# Patient Record
Sex: Female | Born: 1975 | Race: Black or African American | Hispanic: No | Marital: Single | Smoking: Never smoker
Health system: Southern US, Community
[De-identification: ages and names within clinical notes are randomized; demographics above are authoritative.]

## PROBLEM LIST (undated history)

## (undated) DIAGNOSIS — Z973 Presence of spectacles and contact lenses: Secondary | ICD-10-CM

## (undated) DIAGNOSIS — G43909 Migraine, unspecified, not intractable, without status migrainosus: Secondary | ICD-10-CM

## (undated) DIAGNOSIS — H509 Unspecified strabismus: Secondary | ICD-10-CM

## (undated) DIAGNOSIS — O24419 Gestational diabetes mellitus in pregnancy, unspecified control: Secondary | ICD-10-CM

## (undated) DIAGNOSIS — IMO0002 Reserved for concepts with insufficient information to code with codable children: Secondary | ICD-10-CM

## (undated) DIAGNOSIS — R609 Edema, unspecified: Secondary | ICD-10-CM

## (undated) DIAGNOSIS — K219 Gastro-esophageal reflux disease without esophagitis: Secondary | ICD-10-CM

## (undated) DIAGNOSIS — D649 Anemia, unspecified: Secondary | ICD-10-CM

## (undated) HISTORY — DX: Gastro-esophageal reflux disease without esophagitis: K21.9

## (undated) HISTORY — DX: Migraine, unspecified, not intractable, without status migrainosus: G43.909

## (undated) HISTORY — DX: Presence of spectacles and contact lenses: Z97.3

## (undated) HISTORY — DX: Anemia, unspecified: D64.9

## (undated) HISTORY — PX: TONSILLECTOMY: SUR1361

## (undated) HISTORY — DX: Reserved for concepts with insufficient information to code with codable children: IMO0002

## (undated) HISTORY — DX: Unspecified strabismus: H50.9

## (undated) HISTORY — PX: CHOLECYSTECTOMY: SHX55

## (undated) HISTORY — PX: KNEE SURGERY: SHX244

## (undated) HISTORY — DX: Edema, unspecified: R60.9

## (undated) HISTORY — DX: Gestational diabetes mellitus in pregnancy, unspecified control: O24.419

---

## 2000-07-12 DIAGNOSIS — O24419 Gestational diabetes mellitus in pregnancy, unspecified control: Secondary | ICD-10-CM

## 2000-07-12 HISTORY — DX: Gestational diabetes mellitus in pregnancy, unspecified control: O24.419

## 2009-11-11 ENCOUNTER — Emergency Department: Payer: Self-pay | Admitting: Emergency Medicine

## 2010-02-21 ENCOUNTER — Emergency Department: Payer: Self-pay | Admitting: Emergency Medicine

## 2012-07-12 DIAGNOSIS — R609 Edema, unspecified: Secondary | ICD-10-CM

## 2012-07-12 HISTORY — DX: Edema, unspecified: R60.9

## 2012-09-23 ENCOUNTER — Emergency Department (HOSPITAL_COMMUNITY)
Admission: EM | Admit: 2012-09-23 | Discharge: 2012-09-23 | Disposition: A | Discharge status: Home / Self Care | Payer: BC Managed Care – PPO | Source: Home / Self Care | Attending: Emergency Medicine | Admitting: Emergency Medicine

## 2012-09-23 ENCOUNTER — Emergency Department (INDEPENDENT_AMBULATORY_CARE_PROVIDER_SITE_OTHER): Payer: BC Managed Care – PPO

## 2012-09-23 ENCOUNTER — Encounter (HOSPITAL_COMMUNITY): Payer: Self-pay | Admitting: Emergency Medicine

## 2012-09-23 DIAGNOSIS — J40 Bronchitis, not specified as acute or chronic: Secondary | ICD-10-CM

## 2012-09-23 MED ORDER — ALBUTEROL SULFATE HFA 108 (90 BASE) MCG/ACT IN AERS: 1.0000 | INHALATION_SPRAY | RESPIRATORY_TRACT | Status: DC | PRN

## 2012-09-23 MED ORDER — IPRATROPIUM BROMIDE 0.02 % IN SOLN
0.5000 mg | Freq: Once | RESPIRATORY_TRACT | Status: AC
  Administered 2012-09-23: 0.5 mg via RESPIRATORY_TRACT

## 2012-09-23 MED ORDER — ALBUTEROL SULFATE (5 MG/ML) 0.5% IN NEBU
INHALATION_SOLUTION | RESPIRATORY_TRACT | Status: AC
  Filled 2012-09-23: qty 1

## 2012-09-23 MED ORDER — ALBUTEROL SULFATE (5 MG/ML) 0.5% IN NEBU
5.0000 mg | INHALATION_SOLUTION | Freq: Once | RESPIRATORY_TRACT | Status: AC
  Administered 2012-09-23: 5 mg via RESPIRATORY_TRACT

## 2012-09-23 MED ORDER — HYDROCOD POLST-CHLORPHEN POLST 10-8 MG/5ML PO LQCR: 5.0000 mL | Freq: Two times a day (BID) | ORAL | Status: DC | PRN

## 2012-09-23 MED ORDER — AZITHROMYCIN 250 MG PO TABS: 250.0000 mg | ORAL_TABLET | Freq: Every day | ORAL | Status: DC

## 2012-09-23 MED ORDER — PREDNISONE 20 MG PO TABS: 20.0000 mg | ORAL_TABLET | Freq: Two times a day (BID) | ORAL | Status: DC

## 2012-09-23 NOTE — ED Notes (Signed)
Pt c/o dry nonproductive persistent cough x 4 days. And headache. Pt states felt feverish and had chills last night. No hx of asthma or migraines. Pt has been taking tylenol cold and flu with no relief.  Denies n/v/d

## 2012-09-23 NOTE — ED Notes (Signed)
Patient receiving breathing treatment unable to transport to x-ray 

## 2012-09-27 NOTE — ED Provider Notes (Signed)
History     CSN: [id number]  Arrival date & time 09/23/12  1624   First MD Initiated Contact with Patient 09/23/12 1650      Chief Complaint  Patient presents with  . Cough    prersistant dry cough   . Headache     HPI: Patient is a 37 y.o. female presenting with cough and headaches. The history is provided by the patient.  Cough Cough characteristics:  Non-productive Progression:  Gradually worsening Chronicity:  New Associated symptoms: chills, ear fullness, fever, headaches and wheezing   Associated symptoms: no chest pain, no ear pain, no myalgias, no rash, no rhinorrhea, no shortness of breath, no sore throat and no weight loss   Headache Associated symptoms: cough and fever   Associated symptoms: no abdominal pain, no diarrhea, no drainage, no ear pain, no myalgias, no nausea, no sore throat and no vomiting   Cough This is a new problem. The current episode started in the past 7 days. The problem has been gradually worsening. The problem occurs every few minutes. The cough is non-productive. Associated symptoms include chills, ear congestion, a fever, headaches and wheezing. Pertinent negatives include no chest pain, ear pain, myalgias, nasal congestion, postnasal drip, rash, rhinorrhea, sore throat, shortness of breath, sweats or weight loss. She has tried nothing for the symptoms. There is no history of asthma, bronchitis, COPD, emphysema, environmental allergies or pneumonia.  Headache  Associated symptoms include coughing and a fever. Pertinent negatives include no abdominal pain, ear pain, nausea, rhinorrhea, sore throat, vomiting or weight loss.  Pt reports onset of persistent cough on Thursday that has gradually worsened. On Friday pt began to have chills and felt as if she had fever. The cough is a dry, forceful cough that often makes her feel dizzy. Pt denies any other associated URI type symptoms. Denies N/V/D, abd pain, CP or SOB. Pt does not smoke or have h/o asthma.  States she has coughed so much she has a h/a. Coughing makes the h/a worse. Denies known sick contacts.  History reviewed. No pertinent past medical history.  History reviewed. No pertinent past surgical history.  History reviewed. No pertinent family history.  History  Substance Use Topics  . Smoking status: Never Smoker   . Smokeless tobacco: Not on file  . Alcohol Use: No    OB History   Grav Para Term Preterm Abortions TAB SAB Ect Mult Living                  Review of Systems  Constitutional: Positive for fever and chills. Negative for weight loss.  HENT: Negative for ear pain, sore throat, rhinorrhea and postnasal drip.   Respiratory: Positive for cough and wheezing. Negative for shortness of breath.   Cardiovascular: Negative.  Negative for chest pain and palpitations.  Gastrointestinal: Negative.  Negative for nausea, vomiting, abdominal pain, diarrhea and constipation.  Endocrine: Negative.   Genitourinary: Negative.   Musculoskeletal: Negative.  Negative for myalgias.  Skin: Negative.  Negative for rash.  Allergic/Immunologic: Negative.  Negative for environmental allergies.  Neurological: Positive for headaches.  Hematological: Negative.   Psychiatric/Behavioral: Negative.     Allergies  Review of patient's allergies indicates not on file.  Home Medications   Current Outpatient Rx  Name  Route  Sig  Dispense  Refill  . Norethin Ace-Eth Estrad-FE (LOESTRIN FE 1/20 PO)   Oral   Take by mouth.         Marland Kitchen albuterol (PROVENTIL HFA;VENTOLIN HFA) 108 (  90 BASE) MCG/ACT inhaler   Inhalation   Inhale 1-2 puffs into the lungs every 4 (four) hours as needed for wheezing or shortness of breath (and or cough).   1 Inhaler   0   . azithromycin (ZITHROMAX Z-PAK) 250 MG tablet   Oral   Take 1 tablet (250 mg total) by mouth daily. Take 2 tabs on day 1, then 1 tab daily on days 2-5   6 tablet   0   . chlorpheniramine-HYDROcodone (TUSSIONEX PENNKINETIC ER) 10-8  MG/5ML LQCR   Oral   Take 5 mLs by mouth every 12 (twelve) hours as needed.   40 mL   0   . predniSONE (DELTASONE) 20 MG tablet   Oral   Take 1 tablet (20 mg total) by mouth 2 (two) times daily.   10 tablet   0     BP 135/100  Pulse 92  Temp(Src) 99.4 F (37.4 C) (Oral)  Resp 24  SpO2 100%  LMP 09/21/2012  Physical Exam  Constitutional: She is oriented to person, place, and time. She appears well-developed and well-nourished.  HENT:  Head: Normocephalic and atraumatic.  Right Ear: Tympanic membrane, external ear and ear canal normal.  Left Ear: Tympanic membrane, external ear and ear canal normal.  Nose: Nose normal.  Mouth/Throat: Uvula is midline, oropharynx is clear and moist and mucous membranes are normal.  Eyes: Conjunctivae are normal.  Neck: Neck supple.  Cardiovascular: Normal rate and regular rhythm.   Pulmonary/Chest:  Mildly tachypneic w/o accessory muscle use.  BBS w/ decreased air movement bil and expiratory wheezes.  Musculoskeletal: Normal range of motion.  Neurological: She is alert and oriented to person, place, and time.  Skin: Skin is warm and dry.  Psychiatric: She has a normal mood and affect.    ED Course  Procedures (including critical care time)  Labs Reviewed - No data to display No results found.   1. Bronchitis       MDM  3 day h/o persistent dry cough, skills and subjective fever. Cough gradually worsening, coughing spells causing h/a and at times dizziness. BBS w/ decreased air movement and exp wheezes. Much improved after 2 nebs. CXR neg for PNA. Afebrile here today. Will treat for bronchitis w/ Z-Pack, Albuterol HFA, Prednisone and short course of medication for cough. Discussed pt w/ Dr Lorenz Coaster who is in agreement w/ plan.         Leanne Chang, NP 09/28/12 [phone]

## 2012-09-28 NOTE — ED Provider Notes (Signed)
Medical screening examination/treatment/procedure(s) were performed by non-physician practitioner and as supervising physician I was immediately available for consultation/collaboration.  Leslee Home, M.D.  Reuben Likes, MD 09/28/12 [phone]

## 2013-02-14 ENCOUNTER — Ambulatory Visit (INDEPENDENT_AMBULATORY_CARE_PROVIDER_SITE_OTHER): Payer: BC Managed Care – PPO | Admitting: Emergency Medicine

## 2013-02-14 VITALS — BP 124/72 | HR 70 | Temp 97.8°F | Resp 18 | Ht 65.0 in | Wt 286.0 lb

## 2013-02-14 DIAGNOSIS — R609 Edema, unspecified: Secondary | ICD-10-CM

## 2013-02-14 DIAGNOSIS — R109 Unspecified abdominal pain: Secondary | ICD-10-CM

## 2013-02-14 DIAGNOSIS — Z309 Encounter for contraceptive management, unspecified: Secondary | ICD-10-CM

## 2013-02-14 DIAGNOSIS — M545 Low back pain, unspecified: Secondary | ICD-10-CM

## 2013-02-14 DIAGNOSIS — IMO0001 Reserved for inherently not codable concepts without codable children: Secondary | ICD-10-CM

## 2013-02-14 DIAGNOSIS — N898 Other specified noninflammatory disorders of vagina: Secondary | ICD-10-CM

## 2013-02-14 DIAGNOSIS — Z0000 Encounter for general adult medical examination without abnormal findings: Secondary | ICD-10-CM

## 2013-02-14 LAB — POCT URINALYSIS DIPSTICK
Bilirubin, UA: NEGATIVE
Glucose, UA: NEGATIVE
Ketones, UA: NEGATIVE
Leukocytes, UA: NEGATIVE

## 2013-02-14 LAB — POCT UA - MICROSCOPIC ONLY: Mucus, UA: NEGATIVE

## 2013-02-14 LAB — COMPREHENSIVE METABOLIC PANEL
AST: 15 U/L (ref 0–37)
Albumin: 4 g/dL (ref 3.5–5.2)
BUN: 9 mg/dL (ref 6–23)
Calcium: 9.3 mg/dL (ref 8.4–10.5)
Chloride: 104 mEq/L (ref 96–112)
Glucose, Bld: 96 mg/dL (ref 70–99)
Potassium: 4.4 mEq/L (ref 3.5–5.3)
Total Protein: 7.6 g/dL (ref 6.0–8.3)

## 2013-02-14 LAB — POCT WET PREP WITH KOH
Trichomonas, UA: NEGATIVE
Yeast Wet Prep HPF POC: NEGATIVE

## 2013-02-14 LAB — POCT CBC
Granulocyte percent: 53.7 %G (ref 37–80)
HCT, POC: 34.2 % — AB (ref 37.7–47.9)
Hemoglobin: 10.2 g/dL — AB (ref 12.2–16.2)
MCV: 70.6 fL — AB (ref 80–97)
POC LYMPH PERCENT: 37.7 %L (ref 10–50)
RBC: 4.85 M/uL (ref 4.04–5.48)
RDW, POC: 18.6 %

## 2013-02-14 LAB — IRON AND TIBC
%SAT: 4 % — ABNORMAL LOW (ref 20–55)
TIBC: 470 ug/dL (ref 250–470)

## 2013-02-14 MED ORDER — NORETHIN ACE-ETH ESTRAD-FE 1-20 MG-MCG PO TABS: 1.0000 | ORAL_TABLET | Freq: Every day | ORAL | Status: DC

## 2013-02-14 MED ORDER — METRONIDAZOLE 0.75 % VA GEL: 1.0000 | Freq: Two times a day (BID) | VAGINAL | Status: DC

## 2013-02-14 MED ORDER — FUROSEMIDE 20 MG PO TABS: ORAL_TABLET | ORAL | Status: DC

## 2013-02-14 NOTE — Patient Instructions (Signed)
Take a multivitamin with iron one a day. We have started you on your birth control pills he also had bacterial vaginitis and a prescription has been sent and for this

## 2013-02-14 NOTE — Progress Notes (Signed)
@[email or web address] @  Patient ID: Sherri Lindsey MRN: [id number], DOB: 27-Jun-1976, 37 y.o. Date of Encounter: 02/14/2013, 11:34 AM  Primary Physician: No primary provider on file.  Chief Complaint: Physical (CPE)  HPI: 37 y.o. y/o female with history of noted below here for CPE.  Doing well. No issues/complaints.  LMP: 02/03/2013 Pap: MMG: Review of Systems:  Consitutional: No fever, chills, fatigue, night sweats, lymphadenopathy, or weight changes. Eyes: No visual changes, eye redness, or discharge. ENT/Mouth: Ears: No otalgia, tinnitus, hearing loss, discharge.  Right ear cerumen impaction. Nose: No congestion, rhinorrhea, sinus pain, or epistaxis. Throat: No sore throat, post nasal drip, or teeth pain. Cardiovascular: No CP, palpitations, diaphoresis, DOE, orthopnea, PND. Has swelling in feet and ankles Respiratory: She has no respiratory complaints.  Gastrointestinal: No anorexia, dysphagia, reflux, pain, nausea, vomiting, hematemesis, diarrhea, constipation, BRBPR, or melena. Breast: No discharge, pain, swelling, or mass. Genitourinary: No dysuria, frequency, urgency, hematuria, incontinence, nocturia, amenorrhea, vaginal discharge, pruritis, burning, abnormal bleeding, or pain. Musculoskeletal: No decreased ROM, myalgias, joint swelling, or weakness. Both hands usually locks and stiffens up. Back spasms. Skin: No rash, erythema, lesion changes, pain, warmth, jaundice, or pruritis. Neurological: No headache, dizziness, syncope, seizures, tremors, memory loss, coordination problems, or paresthesias. Psychological: No anxiety, depression, hallucinations, SI/HI. Endocrine: No fatigue, polydipsia, polyphagia, polyuria, or known diabetes. All other systems were reviewed and are otherwise negative.  Past Medical History  Diagnosis Date  . Ulcer   . Diabetes mellitus without complication      Past Surgical History  Procedure Laterality Date  . Cholecystectomy      Home Meds:  Prior to  Admission medications   Medication Sig Start Date End Date Taking? Authorizing Provider  albuterol (PROVENTIL HFA;VENTOLIN HFA) 108 (90 BASE) MCG/ACT inhaler Inhale 1-2 puffs into the lungs every 4 (four) hours as needed for wheezing or shortness of breath (and or cough). 09/23/12   Roma Kayser Schorr, NP  azithromycin (ZITHROMAX Z-PAK) 250 MG tablet Take 1 tablet (250 mg total) by mouth daily. Take 2 tabs on day 1, then 1 tab daily on days 2-5 09/23/12   Roma Kayser Schorr, NP  chlorpheniramine-HYDROcodone (TUSSIONEX PENNKINETIC ER) 10-8 MG/5ML LQCR Take 5 mLs by mouth every 12 (twelve) hours as needed. 09/23/12   Roma Kayser Schorr, NP  Norethin Ace-Eth Estrad-FE (LOESTRIN FE 1/20 PO) Take by mouth.    Historical Provider, MD  predniSONE (DELTASONE) 20 MG tablet Take 1 tablet (20 mg total) by mouth 2 (two) times daily. 09/23/12   Leanne Chang, NP    Allergies: No Known Allergies  History   Social History  . Marital Status: Single    Spouse Name: N/A    Number of Children: N/A  . Years of Education: N/A   Occupational History  . Not on file.   Social History Main Topics  . Smoking status: Never Smoker   . Smokeless tobacco: Not on file  . Alcohol Use: No  . Drug Use: No  . Sexually Active: Yes    Birth Control/ Protection: Pill   Other Topics Concern  . Not on file   Social History Narrative  . No narrative on file    Family History  Problem Relation Age of Onset  . Hypertension Mother   . Hypertension Father   . Kidney failure Father   . Hyperlipidemia Father   . Hypertension Sister   . Kidney failure Sister   . Hypertension Brother   . Hypertension Maternal Grandmother     Physical Exam  Blood pressure 124/72, pulse 70, temperature 97.8 F (36.6 C), temperature source Oral, resp. rate 18, height 5\' 5"  (1.651 m), weight 286 lb (129.729 kg), last menstrual period 02/06/2013, SpO2 99.00%., Body mass index is 47.59 kg/(m^2). General: Well developed, well  nourished, in no acute distress. HEENT: Normocephalic, atraumatic. Conjunctiva pink, sclera non-icteric. Pupils 2 mm constricting to 1 mm, round, regular, and equally reactive to light and accomodation. EOMI. Internal auditory canal clear. TMs with good cone of light and without pathology. Nasal mucosa pink. Nares are without discharge. No sinus tenderness. Oral mucosa pink. Dentition. Pharynx without exudate.   Neck: Supple. Trachea midline. No thyromegaly. Full ROM. No lymphadenopathy. Lungs: Clear to auscultation bilaterally without wheezes, rales, or rhonchi. Breathing is of normal effort and unlabored. Cardiovascular: RRR with S1 S2. No murmurs, rubs, or gallops appreciated. Distal pulses 2+ symmetrically. No carotid or abdominal bruits Breast:  No masses are felt  Abdomen: Soft, non-tender, non-distended with normoactive bowel sounds. No hepatosplenomegaly or masses. No rebound/guarding. No CVA tenderness. Without hernias.  Genitourinary: normal female uterus is normal size there are no cervical erosions noted    Musculoskeletal: Full range of motion and 5/5 strength throughout. Without swelling, atrophy, tenderness, crepitus, or warmth. Extremities without clubbing, cyanosis, or edema. Calves supple. Skin: Warm and moist without erythema, ecchymosis, wounds, or rash. Neuro: A+Ox3. CN II-XII grossly intact. Moves all extremities spontaneously. Full sensation throughout. Normal gait. DTR 2+ throughout upper and lower extremities. Finger to nose intact. Psych:  Responds to questions appropriately with a normal affect.    there is 1+ swelling of the lower extremities.   Results for orders placed in visit on 02/14/13  POCT CBC      Result Value Range   WBC 5.1  4.6 - 10.2 K/uL   Lymph, poc 1.9  0.6 - 3.4   POC LYMPH PERCENT 37.7  10 - 50 %L   MID (cbc) 0.4  0 - 0.9   POC MID % 8.6  0 - 12 %M   POC Granulocyte 2.7  2 - 6.9   Granulocyte percent 53.7  37 - 80 %G   RBC 4.85  4.04 - 5.48 M/uL    Hemoglobin 10.2 (*) 12.2 - 16.2 g/dL   HCT, POC 82.9 (*) 56.2 - 47.9 %   MCV 70.6 (*) 80 - 97 fL   MCH, POC 21.0 (*) 27 - 31.2 pg   MCHC 29.8 (*) 31.8 - 35.4 g/dL   RDW, POC 13.0     Platelet Count, POC 326  142 - 424 K/uL   MPV 10.3  0 - 99.8 fL  POCT URINE PREGNANCY      Result Value Range   Preg Test, Ur Negative    POCT URINALYSIS DIPSTICK      Result Value Range   Color, UA yellow     Clarity, UA clear     Glucose, UA neg     Bilirubin, UA neg     Ketones, UA neg     Spec Grav, UA 1.025     Blood, UA small     pH, UA 6.0     Protein, UA neg     Urobilinogen, UA 0.2     Nitrite, UA neg     Leukocytes, UA Negative    POCT UA - MICROSCOPIC ONLY      Result Value Range   WBC, Ur, HPF, POC 0-3     RBC, urine, microscopic 3-5     Bacteria, U  Microscopic trace     Mucus, UA neg     Epithelial cells, urine per micros 3-5     Crystals, Ur, HPF, POC neg     Casts, Ur, LPF, POC neg     Yeast, UA neg    POCT WET PREP WITH KOH      Result Value Range   Trichomonas, UA Negative     Clue Cells Wet Prep HPF POC 3-6     Epithelial Wet Prep HPF POC 6-8     Yeast Wet Prep HPF POC neg     Bacteria Wet Prep HPF POC 1+     RBC Wet Prep HPF POC 4-7     WBC Wet Prep HPF POC 3-5     KOH Prep POC Negative       Assessment/Plan:  37 y.o. y/o female here for CPE  -  Signed, Earl Lites, MD 02/14/2013 11:34 AM

## 2013-02-15 LAB — SICKLE CELL SCREEN: Sickle Cell Screen: NEGATIVE

## 2013-02-15 LAB — PAP IG, CT-NG, RFX HPV ASCU

## 2013-02-15 LAB — URINE CULTURE: Organism ID, Bacteria: NO GROWTH

## 2013-02-15 LAB — HIV ANTIBODY (ROUTINE TESTING W REFLEX): HIV: NONREACTIVE

## 2013-02-22 ENCOUNTER — Telehealth: Payer: Self-pay

## 2013-02-22 NOTE — Telephone Encounter (Signed)
Patient is calling to find out lab results.  Please call at [phone].

## 2013-02-23 NOTE — Telephone Encounter (Signed)
Called pt, advised labs normal.

## 2014-01-01 IMAGING — CR DG CHEST 2V
2 series · 2 of 2 positions shown · non-contrast
Comparison: None.

CLINICAL DATA: Cough, headache.  Symptoms worse for the last 3
days.  Short of breath.

CHEST - 2 VIEW

[view not recorded (1 of 2)]
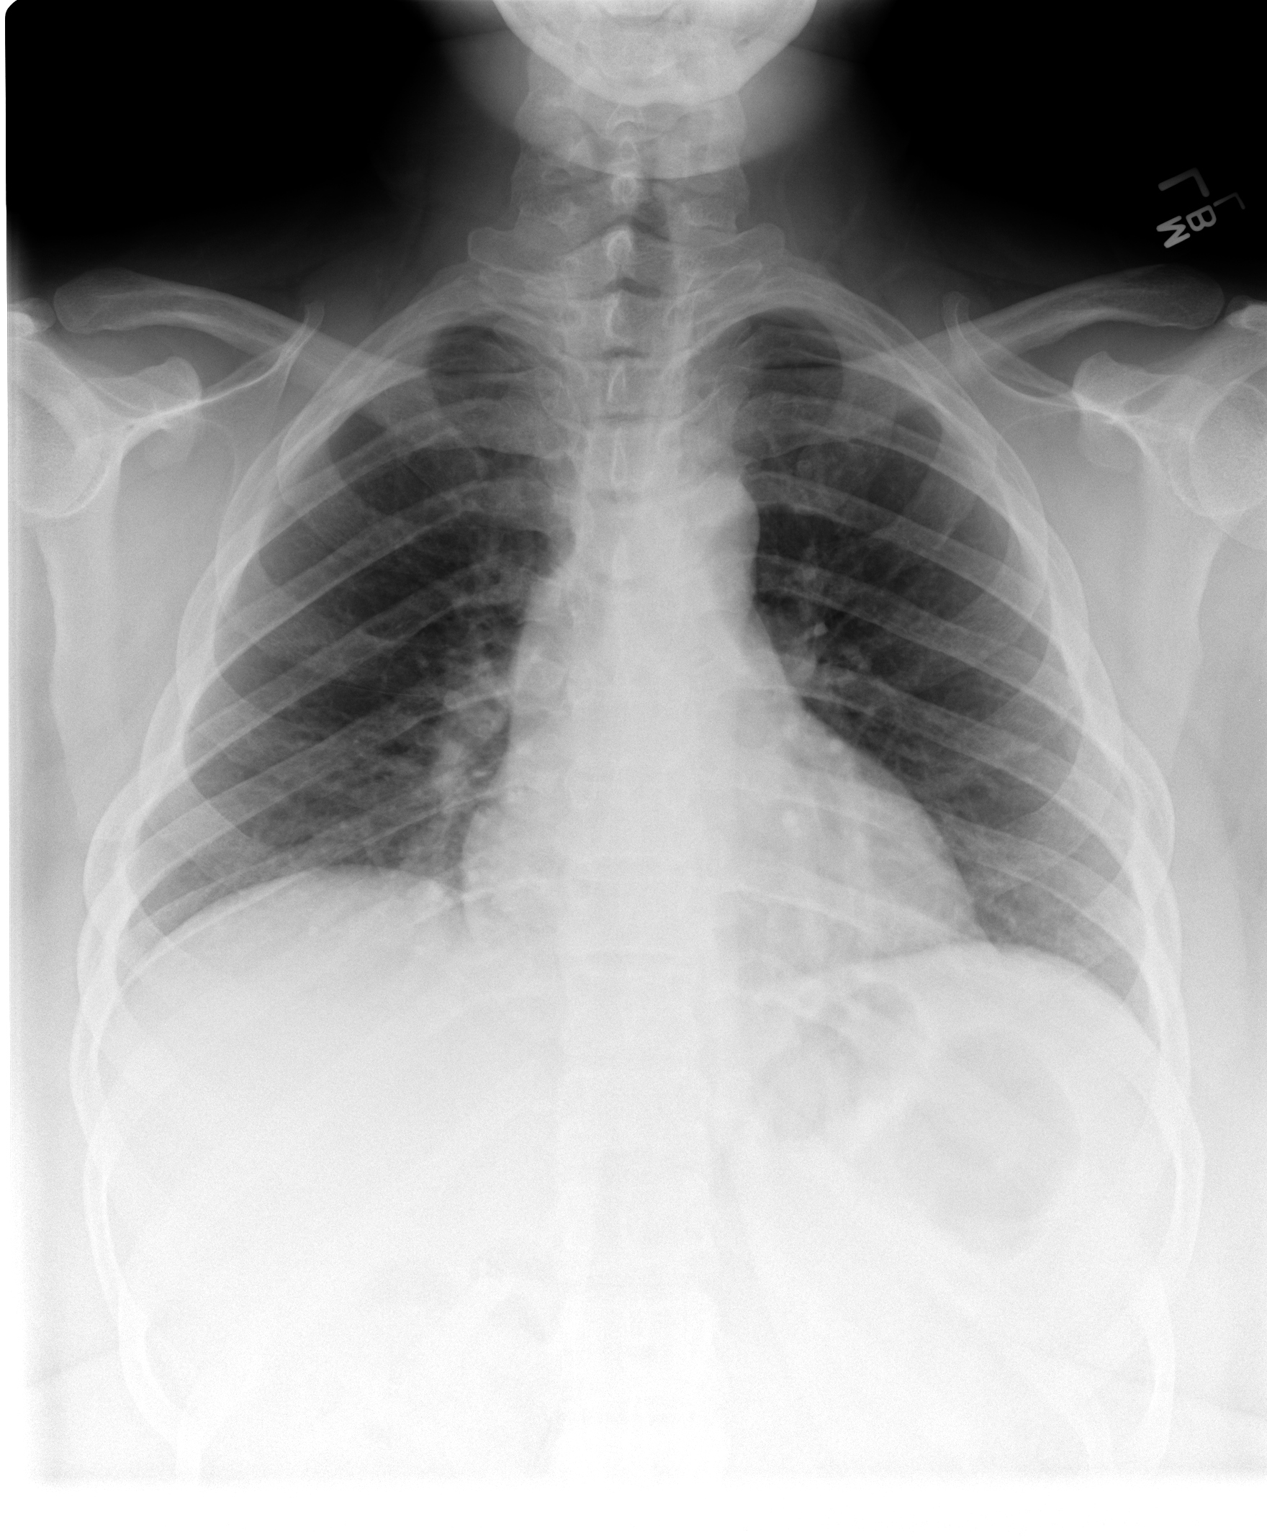

[view not recorded (2 of 2)]
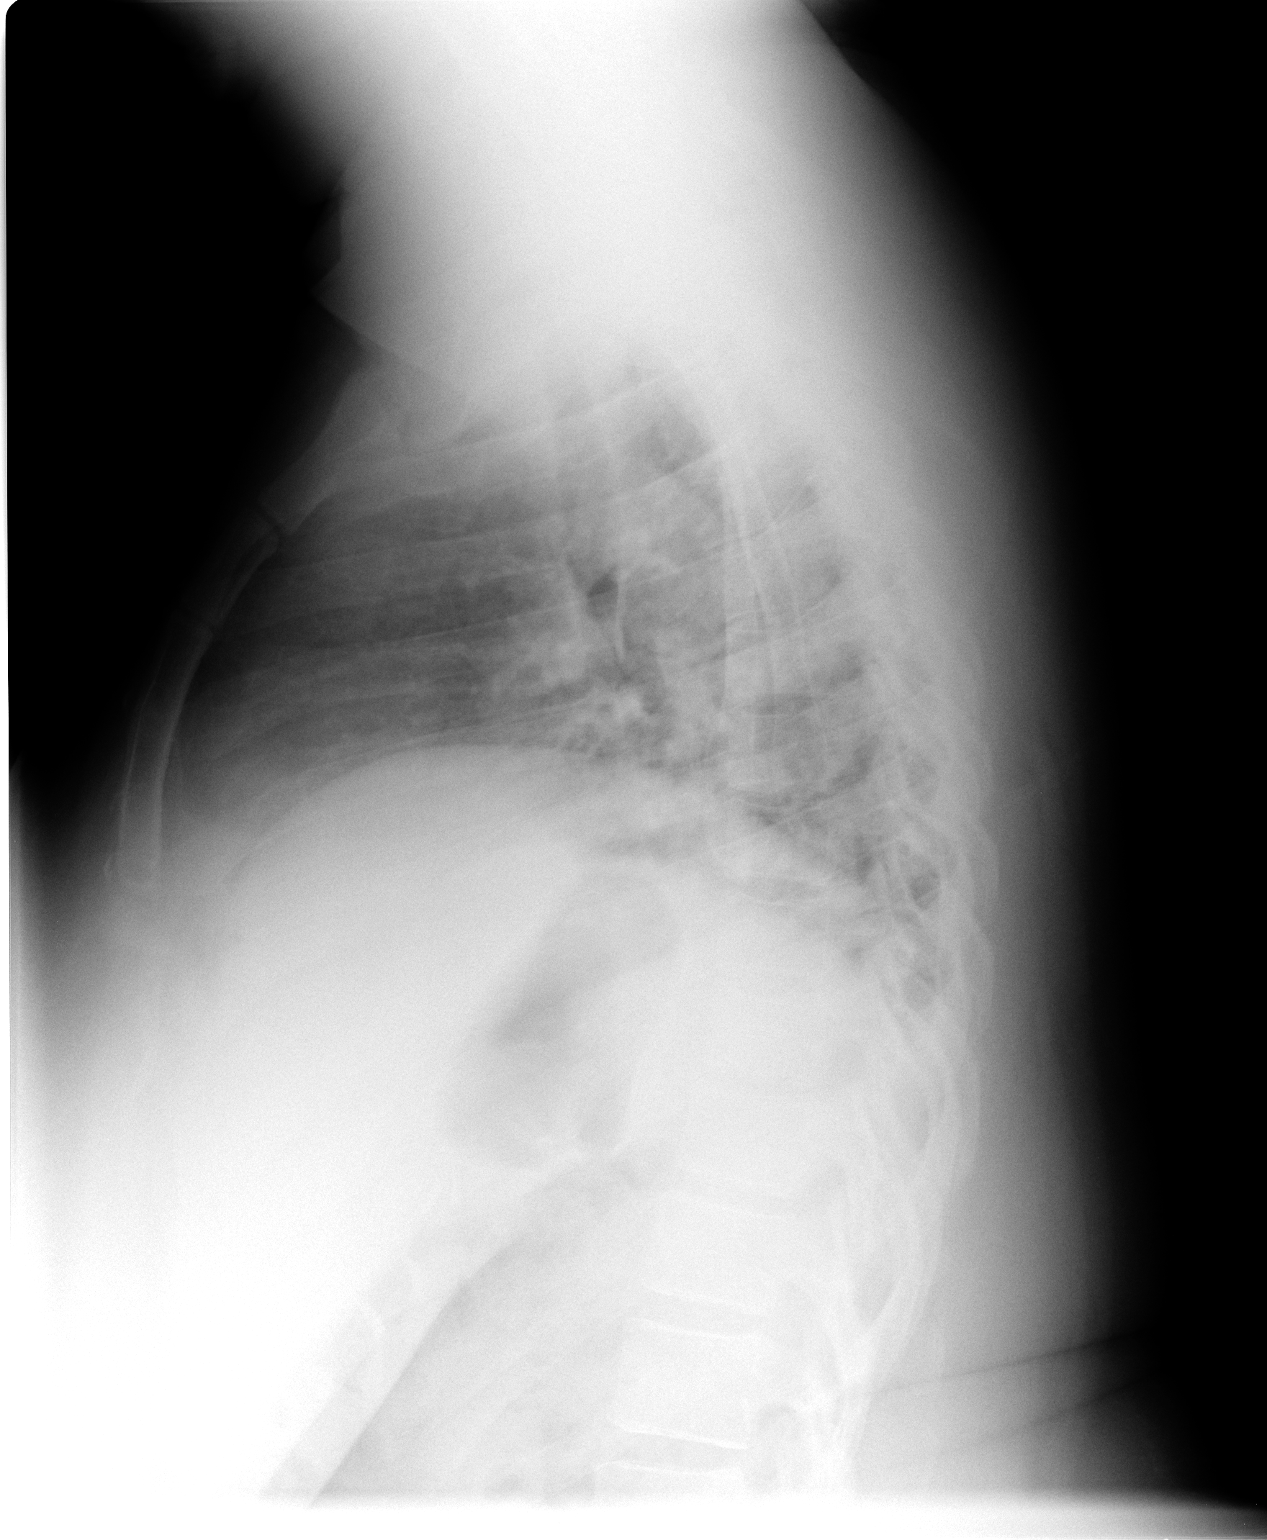

[2 of 2 positions shown; findings below may reference images not displayed]

FINDINGS: Heart size is normal.  Film is made with shallow lung
inflation.  There are no focal consolidations or pleural effusions.
No pulmonary edema.
IMPRESSION: 1.  Shallow inflation.
2. No evidence for acute cardiopulmonary abnormality.

## 2014-08-19 ENCOUNTER — Encounter: Payer: Self-pay | Admitting: Medical

## 2014-08-19 ENCOUNTER — Ambulatory Visit (INDEPENDENT_AMBULATORY_CARE_PROVIDER_SITE_OTHER): Payer: BLUE CROSS/BLUE SHIELD | Admitting: Medical

## 2014-08-19 VITALS — BP 128/80 | HR 72 | Temp 98.2°F | Resp 16 | Ht 65.0 in | Wt 288.0 lb

## 2014-08-19 DIAGNOSIS — R631 Polydipsia: Secondary | ICD-10-CM

## 2014-08-19 DIAGNOSIS — R0609 Other forms of dyspnea: Secondary | ICD-10-CM

## 2014-08-19 DIAGNOSIS — G43011 Migraine without aura, intractable, with status migrainosus: Secondary | ICD-10-CM

## 2014-08-19 DIAGNOSIS — R29898 Other symptoms and signs involving the musculoskeletal system: Secondary | ICD-10-CM

## 2014-08-19 DIAGNOSIS — R0602 Shortness of breath: Secondary | ICD-10-CM

## 2014-08-19 DIAGNOSIS — R609 Edema, unspecified: Secondary | ICD-10-CM

## 2014-08-19 DIAGNOSIS — E669 Obesity, unspecified: Secondary | ICD-10-CM

## 2014-08-19 LAB — POCT URINALYSIS DIPSTICK
GLUCOSE UA: NEGATIVE
Leukocytes, UA: NEGATIVE
NITRITE UA: NEGATIVE
PH UA: 5.5
Spec Grav, UA: >=1.03
UROBILINOGEN UA: NEGATIVE

## 2014-08-19 LAB — CBC WITH DIFFERENTIAL/PLATELET
BASOS ABS: 0.1 10*3/uL (ref 0.0–0.1)
Basophils Relative: 1 % (ref 0–1)
Eosinophils Absolute: 0.1 10*3/uL (ref 0.0–0.7)
Eosinophils Relative: 1 % (ref 0–5)
HCT: 33.6 % — ABNORMAL LOW (ref 36.0–46.0)
Hemoglobin: 9.9 g/dL — ABNORMAL LOW (ref 12.0–15.0)
Lymphocytes Relative: 39 % (ref 12–46)
Lymphs Abs: 2.5 10*3/uL (ref 0.7–4.0)
MCH: 21.2 pg — ABNORMAL LOW (ref 26.0–34.0)
MCHC: 29.5 g/dL — AB (ref 30.0–36.0)
MCV: 71.8 fL — ABNORMAL LOW (ref 78.0–100.0)
MONOS PCT: 10 % (ref 3–12)
MPV: 10.2 fL (ref 8.6–12.4)
Monocytes Absolute: 0.6 10*3/uL (ref 0.1–1.0)
NEUTROS ABS: 3.1 10*3/uL (ref 1.7–7.7)
Neutrophils Relative %: 49 % (ref 43–77)
Platelets: 373 10*3/uL (ref 150–400)
RBC: 4.68 MIL/uL (ref 3.87–5.11)
RDW: 17.3 % — AB (ref 11.5–15.5)
WBC: 6.3 10*3/uL (ref 4.0–10.5)

## 2014-08-19 LAB — COMPREHENSIVE METABOLIC PANEL
ALBUMIN: 3.9 g/dL (ref 3.5–5.2)
ALK PHOS: 78 U/L (ref 39–117)
ALT: 10 U/L (ref 0–35)
AST: 14 U/L (ref 0–37)
BILIRUBIN TOTAL: 0.5 mg/dL (ref 0.2–1.2)
BUN: 9 mg/dL (ref 6–23)
CALCIUM: 8.9 mg/dL (ref 8.4–10.5)
CO2: 28 mEq/L (ref 19–32)
CREATININE: 0.88 mg/dL (ref 0.50–1.10)
Chloride: 103 mEq/L (ref 96–112)
Glucose, Bld: 123 mg/dL — ABNORMAL HIGH (ref 70–99)
Potassium: 4 mEq/L (ref 3.5–5.3)
Sodium: 138 mEq/L (ref 135–145)
Total Protein: 7.3 g/dL (ref 6.0–8.3)

## 2014-08-19 MED ORDER — HYDROCODONE-ACETAMINOPHEN 5-325 MG PO TABS: 1.0000 | ORAL_TABLET | Freq: Four times a day (QID) | ORAL | Status: DC | PRN

## 2014-08-19 MED ORDER — PROMETHAZINE HCL 25 MG PO TABS: 25.0000 mg | ORAL_TABLET | Freq: Three times a day (TID) | ORAL | Status: DC | PRN

## 2014-08-19 NOTE — Progress Notes (Signed)
Subjective: Here as a new patient today.  Was seeing urgent care prior.    Here for SOB and headaches.  Having severe headache since yesterday.   Gets migraines, current headache is right temporal, +photophobia, nausea, 8/10, throbbing which is typical for her.   For the past 3 weeks has been feeling SOB.  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   Has hx/o edema, and still has LE edema intermittent ongoing.   Was in usual state of health prior to this past 3 weeks.  No other aggravating or relieving factors. No other complaint.  Review of Systems Constitutional: -fever, +chills, -sweats, -unexpected weight change,+fatigue ENT: -runny nose, -ear pain, -sore throat Cardiology:  -chest pain, -palpitations, +edema Respiratory: -cough, +shortness of breath, -wheezing Gastroenterology: -abdominal pain, +nausea, -vomiting, -diarrhea, -constipation  Hematology: -bleeding or bruising problems Musculoskeletal: -arthralgias, -myalgias, -joint swelling, +back pain Ophthalmology: +vision changes with strabismus over past few years, worsening Urology: -dysuria, -difficulty urinating, -hematuria, -urinary frequency, -urgency Neurology: +headache, +weakness dropped cups from right hand this past week,+tingling, -numbness, +cramping of hands Gyn: regular periods increased polydipsia Body feels "nervous"  Past Medical History  Diagnosis Date  . Ulcer   . Gestational diabetes 2002  . Edema 2014    intermittent since 2014, diuretic use prior  . Migraine   . Wears glasses   . Strabismus    Past Surgical History  Procedure Laterality Date  . Cholecystectomy    . Tonsillectomy    . Knee surgery      arthroscopic    Objective: BP 128/80 mmHg  Pulse 72  Temp(Src) 98.2 F (36.8 C) (Oral)  Resp 16  Ht 5\' 5"  (1.651 m)  Wt 288 lb (130.636 kg)  BMI 47.93 kg/m2  SpO2 99%  General appearance: alert, no distress, WD/WN, AA female HEENT: normocephalic, sclerae  anicteric, PERRLA, EOMi, nares patent, no discharge or erythema, pharynx normal Oral cavity: MMM, no lesions Neck: supple, no lymphadenopathy, no thyromegaly, no masses, no bruits Heart: RRR, normal S1, S2, no murmurs Lungs: CTA bilaterally, no wheezes, rhonchi, or rales Abdomen: +bs, soft, non tender, non distended, no masses, no hepatomegaly, no splenomegaly Back: non tender Musculoskeletal: nontender, no swelling, no obvious deformity Extremities: no edema, no cyanosis, no clubbing Pulses: 2+ symmetric, upper and lower extremities, normal cap refill Neurological: +strabismus noted, pupils 9mm bilat, alert, oriented x 3, CN2-12 intact, strength normal upper extremities and lower extremities, sensation normal throughout, DTRs 2+ throughout, no cerebellar signs, gait normal Psychiatric: normal affect, behavior normal, pleasant    Adult ECG Report  Indication: DOE  Rate: 70 bpm  Rhythm: normal sinus rhythm  QRS Axis: 46 degrees  PR Interval: 11ms  QRS Duration: 24ms  QTc: 443ms  Conduction Disturbances: none  Other Abnormalities: none  Patient's cardiac risk factors are: obesity (BMI >= 30 kg/m2) and sedentary lifestyle.  EKG comparison: none  Narrative Interpretation: normal EKG    Assessment: Encounter Diagnoses  Name Primary?  . DOE (dyspnea on exertion) Yes  . SOB (shortness of breath)   . Intractable migraine without aura and with status migrainosus   . Obesity   . Edema   . Polydipsia   . Arm weakness    Plan: Reviewed EKG today,labs today.  Discussed possible causes of her symptoms. Of note in the event her labs show anemia she does have heavy periods for quite some time. We did discuss the need for healthy diet and exercise given her weight once  we determine the cause of her DOE.  F/u pending labs.  Temporarily use hydrocodone for headache, promethazine for nausea and vomiting until we get labs back.  Her mother will watch after her kids this evening.   Discussed  risks/benefits of meds.  Avoid NSAIDs until we have her labs back.

## 2014-08-20 ENCOUNTER — Other Ambulatory Visit: Payer: Self-pay | Admitting: Medical

## 2014-08-20 LAB — HEMOGLOBIN A1C
Hgb A1c MFr Bld: 5.9 % — ABNORMAL HIGH (ref <5.7)
MEAN PLASMA GLUCOSE: 123 mg/dL — AB (ref <117)

## 2014-08-20 LAB — TSH: TSH: 1.737 u[IU]/mL (ref 0.350–4.500)

## 2014-08-20 MED ORDER — FERROUS GLUCONATE 324 (38 FE) MG PO TABS: 324.0000 mg | ORAL_TABLET | Freq: Three times a day (TID) | ORAL | Status: DC

## 2015-02-24 ENCOUNTER — Encounter: Payer: Self-pay | Admitting: Family Medicine

## 2015-02-24 ENCOUNTER — Ambulatory Visit: Payer: BLUE CROSS/BLUE SHIELD | Admitting: Family Medicine

## 2015-02-24 ENCOUNTER — Ambulatory Visit (INDEPENDENT_AMBULATORY_CARE_PROVIDER_SITE_OTHER): Payer: BLUE CROSS/BLUE SHIELD | Admitting: Family Medicine

## 2015-02-24 VITALS — BP 110/60 | HR 96

## 2015-02-24 DIAGNOSIS — N943 Premenstrual tension syndrome: Secondary | ICD-10-CM | POA: Diagnosis not present

## 2015-02-24 DIAGNOSIS — G43829 Menstrual migraine, not intractable, without status migrainosus: Secondary | ICD-10-CM

## 2015-02-24 DIAGNOSIS — G43909 Migraine, unspecified, not intractable, without status migrainosus: Secondary | ICD-10-CM | POA: Insufficient documentation

## 2015-02-24 MED ORDER — SUMATRIPTAN SUCCINATE 100 MG PO TABS: ORAL_TABLET | ORAL | Status: DC

## 2015-02-24 NOTE — Progress Notes (Signed)
   Subjective:    Patient ID: Sherri Lindsey, female    DOB: 12-Oct-1975, 39 y.o.   MRN: [id number]  HPI He is here for evaluation of a headache. She has a previous history of migraine headaches dating back to her teenage years. She usually will have one or 2 of these per year. The usually occur at the beginning of her menstrual cycle. She does note the onset of dizziness as well as rates neck and shoulder weakness and then headache starting after that. The headache is always on the right side, throbbing with associated photophobia and phonophobia with nausea. She usually handles this on her own with OTC meds or no medications. She was seen for this by Dorothea Ogle and given codeine but she does not have any.   Review of Systems     Objective:   Physical Exam Alert and in no distress. Tympanic membranes and canals are normal. Pharyngeal area is normal. Neck is supple without adenopathy or thyromegaly. Cardiac exam shows a regular sinus rhythm without murmurs or gallops. Lungs are clear to auscultation.EOMI. DTRs normal.        Assessment & Plan:  Menstrual migraine without status migrainosus, not intractable - Plan: SUMAtriptan (IMITREX) 100 MG tablet I explained the type of headache that she is having. Strongly encouraged her to take ibuprofen 800 mg at the onset of the aura. I explained aura to her. If this does not work she will try the Imitrex. She was given instructions on use of this medication. If she has further difficulty, she will call me.

## 2015-02-24 NOTE — Patient Instructions (Addendum)
The next time you've have that dizziness and the numbness , take 4 ibuprofen one time if the headache occurs, then take the Sumatriptan that I called in and you can repeat that medicine in 2 hours. You can take 800 mg of ibuprofen 3 times per day.

## 2015-07-17 ENCOUNTER — Ambulatory Visit (INDEPENDENT_AMBULATORY_CARE_PROVIDER_SITE_OTHER): Payer: BLUE CROSS/BLUE SHIELD | Admitting: Family Medicine

## 2015-07-17 ENCOUNTER — Encounter: Payer: Self-pay | Admitting: Family Medicine

## 2015-07-17 VITALS — BP 126/76 | HR 68 | Wt 295.6 lb

## 2015-07-17 DIAGNOSIS — M545 Low back pain, unspecified: Secondary | ICD-10-CM

## 2015-07-17 DIAGNOSIS — Z202 Contact with and (suspected) exposure to infections with a predominantly sexual mode of transmission: Secondary | ICD-10-CM | POA: Diagnosis not present

## 2015-07-17 DIAGNOSIS — Z7251 High risk heterosexual behavior: Secondary | ICD-10-CM | POA: Diagnosis not present

## 2015-07-17 LAB — POCT WET PREP (WET MOUNT)
Clue Cells Wet Prep Whiff POC: NEGATIVE
KOH Wet Prep POC: POSITIVE
Trichomonas Wet Prep HPF POC: NEGATIVE

## 2015-07-17 LAB — POCT URINALYSIS DIPSTICK
Bilirubin, UA: NEGATIVE
Blood, UA: NEGATIVE
GLUCOSE UA: NEGATIVE
Ketones, UA: NEGATIVE
LEUKOCYTES UA: NEGATIVE
NITRITE UA: NEGATIVE
PROTEIN UA: NEGATIVE
SPEC GRAV UA: 1.015
UROBILINOGEN UA: NEGATIVE
pH, UA: 6

## 2015-07-17 LAB — POCT URINE PREGNANCY: PREG TEST UR: NEGATIVE

## 2015-07-17 NOTE — Patient Instructions (Addendum)
Try taking 2 Aleve twice daily with food and using heat when for 20 minutes at a time to the area 2-3 times per day for the next 2 weeks and see if this helps with your low back pain. Also try doing the stretches below. If you do not notice any improvement or get worse follow-up with [place]. You were negative for STIs today but we will contact you with your remaining results.   Back Exercises The following exercises strengthen the muscles that help to support the back. They also help to keep the lower back flexible. Doing these exercises can help to prevent back pain or lessen existing pain. If you have back pain or discomfort, try doing these exercises 2-3 times each day or as told by your health care provider. When the pain goes away, do them once each day, but increase the number of times that you repeat the steps for each exercise (do more repetitions). If you do not have back pain or discomfort, do these exercises once each day or as told by your health care provider. EXERCISES Single Knee to Chest Repeat these steps 3-5 times for each leg: 1. Lie on your back on a firm bed or the floor with your legs extended. 2. Bring one knee to your chest. Your other leg should stay extended and in contact with the floor. 3. Hold your knee in place by grabbing your knee or thigh. 4. Pull on your knee until you feel a gentle stretch in your lower back. 5. Hold the stretch for 10-30 seconds. 6. Slowly release and straighten your leg. Pelvic Tilt Repeat these steps 5-10 times: 1. Lie on your back on a firm bed or the floor with your legs extended. 2. Bend your knees so they are pointing toward the ceiling and your feet are flat on the floor. 3. Tighten your lower abdominal muscles to press your lower back against the floor. This motion will tilt your pelvis so your tailbone points up toward the ceiling instead of pointing to your feet or the floor. 4. With gentle tension and even breathing, hold this position  for 5-10 seconds. Cat-Cow Repeat these steps until your lower back becomes more flexible: 1. Get into a hands-and-knees position on a firm surface. Keep your hands under your shoulders, and keep your knees under your hips. You may place padding under your knees for comfort. 2. Let your head hang down, and point your tailbone toward the floor so your lower back becomes rounded like the back of a cat. 3. Hold this position for 5 seconds. 4. Slowly lift your head and point your tailbone up toward the ceiling so your back forms a sagging arch like the back of a cow. 5. Hold this position for 5 seconds. Press-Ups Repeat these steps 5-10 times: 1. Lie on your abdomen (face-down) on the floor. 2. Place your palms near your head, about shoulder-width apart. 3. While you keep your back as relaxed as possible and keep your hips on the floor, slowly straighten your arms to raise the top half of your body and lift your shoulders. Do not use your back muscles to raise your upper torso. You may adjust the placement of your hands to make yourself more comfortable. 4. Hold this position for 5 seconds while you keep your back relaxed. 5. Slowly return to lying flat on the floor. Bridges Repeat these steps 10 times: 1. Lie on your back on a firm surface. 2. Bend your knees so they  are pointing toward the ceiling and your feet are flat on the floor. 3. Tighten your buttocks muscles and lift your buttocks off of the floor until your waist is at almost the same height as your knees. You should feel the muscles working in your buttocks and the back of your thighs. If you do not feel these muscles, slide your feet 1-2 inches farther away from your buttocks. 4. Hold this position for 3-5 seconds. 5. Slowly lower your hips to the starting position, and allow your buttocks muscles to relax completely. If this exercise is too easy, try doing it with your arms crossed over your chest. Abdominal Crunches Repeat these  steps 5-10 times: 1. Lie on your back on a firm bed or the floor with your legs extended. 2. Bend your knees so they are pointing toward the ceiling and your feet are flat on the floor. 3. Cross your arms over your chest. 4. Tip your chin slightly toward your chest without bending your neck. 5. Tighten your abdominal muscles and slowly raise your trunk (torso) high enough to lift your shoulder blades a tiny bit off of the floor. Avoid raising your torso higher than that, because it can put too much stress on your low back and it does not help to strengthen your abdominal muscles. 6. Slowly return to your starting position. Back Lifts Repeat these steps 5-10 times: 1. Lie on your abdomen (face-down) with your arms at your sides, and rest your forehead on the floor. 2. Tighten the muscles in your legs and your buttocks. 3. Slowly lift your chest off of the floor while you keep your hips pressed to the floor. Keep the back of your head in line with the curve in your back. Your eyes should be looking at the floor. 4. Hold this position for 3-5 seconds. 5. Slowly return to your starting position. SEEK MEDICAL CARE IF:  Your back pain or discomfort gets much worse when you do an exercise.  Your back pain or discomfort does not lessen within 2 hours after you exercise. If you have any of these problems, stop doing these exercises right away. Do not do them again unless your health care provider says that you can. SEEK IMMEDIATE MEDICAL CARE IF:  You develop sudden, severe back pain. If this happens, stop doing the exercises right away. Do not do them again unless your health care provider says that you can.   This information is not intended to replace advice given to you by your health care provider. Make sure you discuss any questions you have with your health care provider.   Document Released: 08/05/2004 Document Revised: 03/19/2015 Document Reviewed: 08/22/2014 Elsevier Interactive Patient  Education 2016 [place] Sex Safe sex is about reducing the risk of giving or getting a sexually transmitted disease (STD). STDs are spread through sexual contact involving the genitals, mouth, or rectum. Some STDs can be cured and others cannot. Safe sex can also prevent unintended pregnancies.  WHAT ARE SOME SAFE SEX PRACTICES? 7. Limit your sexual activity to only one partner who is having sex with only you. 8. Talk to your partner about his or her past partners, past STDs, and drug use. 9. Use a condom every time you have sexual intercourse. This includes vaginal, oral, and anal sexual activity. Both females and males should wear condoms during oral sex. Only use latex or polyurethane condoms and water-based lubricants. Using petroleum-based lubricants or oils to lubricate a condom will weaken  the condom and increase the chance that it will break. The condom should be in place from the beginning to the end of sexual activity. Wearing a condom reduces, but does not completely eliminate, your risk of getting or giving an STD. STDs can be spread by contact with infected body fluids and skin. 10. Get vaccinated for hepatitis B and HPV. 11. Avoid alcohol and recreational drugs, which can affect your judgment. You may forget to use a condom or participate in high-risk sex. 72. For females, avoid douching after sexual intercourse. Douching can spread an infection farther into the reproductive tract. 13. Check your body for signs of sores, blisters, rashes, or unusual discharge. See your health care provider if you notice any of these signs. 50. Avoid sexual contact if you have symptoms of an infection or are being treated for an STD. If you or your partner has herpes, avoid sexual contact when blisters are present. Use condoms at all other times. 15. If you are at risk of being infected with HIV, it is recommended that you take a prescription medicine daily to prevent HIV infection. This is  called pre-exposure prophylaxis (PrEP). You are considered at risk if: 1. You are a man who has sex with other men (MSM). 2. You are a heterosexual man or woman who is sexually active with more than one partner. 3. You take drugs by injection. 4. You are sexually active with a partner who has HIV. 27. Talk with your health care provider about whether you are at high risk of being infected with HIV. If you choose to begin PrEP, you should first be tested for HIV. You should then be tested every 3 months for as long as you are taking PrEP. 17. See your health care provider for regular screenings, exams, and tests for other STDs. Before having sex with a new partner, each of you should be screened for STDs and should talk about the results with each other. WHAT ARE THE BENEFITS OF SAFE SEX?  5. There is less chance of getting or giving an STD. 6. You can prevent unwanted or unintended pregnancies. 7. By discussing safe sex concerns with your partner, you may increase feelings of intimacy, comfort, trust, and honesty between the two of you.   This information is not intended to replace advice given to you by your health care provider. Make sure you discuss any questions you have with your health care provider.   Document Released: 08/05/2004 Document Revised: 07/19/2014 Document Reviewed: 12/20/2011 Elsevier Interactive Patient Education [employer].

## 2015-07-17 NOTE — Progress Notes (Signed)
   Subjective:    Patient ID: Sherri Lindsey, female    DOB: 1976-02-17, 40 y.o.   MRN: [id number]  HPI Chief Complaint  Patient presents with  . std    std exposure. not having any symtpoms  . low back pain    low back pain and side pain on left side   She states she received a phone call from her sexual partner and was told that he had trichomonas and that she was exposed. She had unprotected sex 4 days ago. She denies fever, chills, vaginal discharge, irritation, nausea, vomiting, diarrhea. Denies urinary frequency, dysuria, hematuria. States she has had 1 sexual partner in past 6 months. Has history of STI in distant past.   Also complains of unrelated left low back pain for past 3 months and feels like a muscle ache and is non radiating. Pain is worse when standing for a long time or sitting on a soft chair. She has been stretching and doing the massage chair at the gym, taking 2 aleve once daily with some relief.  LMP: June 29, 2015.   Review of Systems Pertinent positives and negatives in the history of present illness.    Objective:   Physical Exam  Constitutional: She appears well-developed and well-nourished. No distress.  Neck: Normal range of motion. Neck supple.  Genitourinary: Vagina normal. There is no rash or lesion on the right labia. There is no rash or lesion on the left labia. Cervix exhibits no discharge. No vaginal discharge found.  Musculoskeletal:       Lumbar back: She exhibits normal range of motion, no tenderness, no pain and no spasm.   BP 126/76 mmHg  Pulse 68  Wt 295 lb 9.6 oz (134.083 kg)    Urinalysis dipstick negative Urine pregnancy negative Wet mount negative for trichomonas, few clue cells and yeast present     Assessment & Plan:  Exposure to STD - Plan: POCT Wet Prep ([place]), RPR, HIV antibody, GC/Chlamydia Probe Amp  Left-sided low back pain without sciatica - Plan: POCT urinalysis dipstick  Unprotected sexual intercourse - Plan:  POCT urine pregnancy  Discussed wet mount findings with patient and discussed that no treatment recommended since she is not having any symptoms. She will let me know if symptoms occur. Will follow up with STI results.  Information provided on safe sex and pregnancy prevention.  Suspect that her low back pain is related to musculoskeletal etiology and recommend that she take 2 Aleve twice daily with food and use heat for the next 2 weeks. Also provided stretches for her back to see if this helps. She will follow-up if no improvement or if she gets worse.

## 2015-07-18 LAB — GC/CHLAMYDIA PROBE AMP
CT PROBE, AMP APTIMA: NOT DETECTED
GC PROBE AMP APTIMA: NOT DETECTED

## 2015-07-18 LAB — HIV ANTIBODY (ROUTINE TESTING W REFLEX): HIV 1&2 Ab, 4th Generation: NONREACTIVE

## 2015-07-18 LAB — RPR

## 2016-09-17 ENCOUNTER — Ambulatory Visit (INDEPENDENT_AMBULATORY_CARE_PROVIDER_SITE_OTHER): Payer: BLUE CROSS/BLUE SHIELD | Admitting: Medical

## 2016-09-17 ENCOUNTER — Encounter: Payer: Self-pay | Admitting: Medical

## 2016-09-17 VITALS — BP 118/72 | HR 106 | Wt 298.2 lb

## 2016-09-17 DIAGNOSIS — R7301 Impaired fasting glucose: Secondary | ICD-10-CM | POA: Insufficient documentation

## 2016-09-17 DIAGNOSIS — R0683 Snoring: Secondary | ICD-10-CM | POA: Insufficient documentation

## 2016-09-17 DIAGNOSIS — G43909 Migraine, unspecified, not intractable, without status migrainosus: Secondary | ICD-10-CM | POA: Diagnosis not present

## 2016-09-17 DIAGNOSIS — G43829 Menstrual migraine, not intractable, without status migrainosus: Secondary | ICD-10-CM | POA: Diagnosis not present

## 2016-09-17 DIAGNOSIS — F419 Anxiety disorder, unspecified: Secondary | ICD-10-CM

## 2016-09-17 DIAGNOSIS — Z862 Personal history of diseases of the blood and blood-forming organs and certain disorders involving the immune mechanism: Secondary | ICD-10-CM

## 2016-09-17 LAB — IRON AND TIBC
%SAT: 3 % — ABNORMAL LOW (ref 11–50)
Iron: 13 ug/dL — ABNORMAL LOW (ref 40–190)
TIBC: 416 ug/dL (ref 250–450)
UIBC: 403 ug/dL — ABNORMAL HIGH (ref 125–400)

## 2016-09-17 LAB — CBC WITH DIFFERENTIAL/PLATELET
Basophils Absolute: 65 cells/uL (ref 0–200)
Basophils Relative: 1 %
EOS PCT: 1 %
Eosinophils Absolute: 65 cells/uL (ref 15–500)
HCT: 35.4 % (ref 35.0–45.0)
HEMOGLOBIN: 10.7 g/dL — AB (ref 11.7–15.5)
LYMPHS ABS: 1950 {cells}/uL (ref 850–3900)
Lymphocytes Relative: 30 %
MCH: 22 pg — ABNORMAL LOW (ref 27.0–33.0)
MCHC: 30.2 g/dL — ABNORMAL LOW (ref 32.0–36.0)
MCV: 72.7 fL — ABNORMAL LOW (ref 80.0–100.0)
MPV: 10.8 fL (ref 7.5–12.5)
Monocytes Absolute: 390 cells/uL (ref 200–950)
Monocytes Relative: 6 %
Neutro Abs: 4030 cells/uL (ref 1500–7800)
Neutrophils Relative %: 62 %
Platelets: 338 10*3/uL (ref 140–400)
RBC: 4.87 MIL/uL (ref 3.80–5.10)
RDW: 16.6 % — AB (ref 11.0–15.0)
WBC: 6.5 10*3/uL (ref 4.0–10.5)

## 2016-09-17 LAB — BASIC METABOLIC PANEL
BUN: 8 mg/dL (ref 7–25)
CALCIUM: 8.7 mg/dL (ref 8.6–10.2)
CO2: 25 mmol/L (ref 20–31)
Chloride: 101 mmol/L (ref 98–110)
Creat: 1.08 mg/dL (ref 0.50–1.10)
GLUCOSE: 153 mg/dL — AB (ref 65–99)
Potassium: 3.9 mmol/L (ref 3.5–5.3)
SODIUM: 135 mmol/L (ref 135–146)

## 2016-09-17 LAB — TSH: TSH: 1.57 m[IU]/L

## 2016-09-17 MED ORDER — SUMATRIPTAN SUCCINATE 100 MG PO TABS: ORAL_TABLET | ORAL | 1 refills | Status: DC

## 2016-09-17 NOTE — Progress Notes (Signed)
Subjective: Chief Complaint  Patient presents with  . headaches, panic attacks    headaches for months, panic attack happens around her monthly periods    Here for migraines.   Feels like the headaches are getting worse as she has gotten older.   Has had similar a year ago but they improved a little.  However, lately having lots of headaches.  Feels like she is having anxiety problems, nerves feel shot.  Works at [employer], Designer, television/film set, full time.   Lives at home with her 2 children.   Stressors  - has 78yo daughter.  Her kids father has nothing to do with them since they were little.  She is a single parent.   Has financial stress, no help from parents.  On her own.   Son is 10yo.   Is stressed at work as well.  Has moments that headache are so bad from looking at compute screen all day, has to walk away from the screen.   Has left work early due to headaches.  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     Takes some OTC migraine medication.    Anxiety and headaches are worse around time of her periods.   Period due to come on now.  Since yesterday, feels jumpy, edge.  Keeps lights off at home, relax and keep cool to help prevent headaches.      Headaches are typically bilateral mid of head, sometimes can't touch top of head due to headache,  headaches are often sharp pains.   sometimes gets nausea, does get photophobia and phonophobia.    No numbness, on tingling or weakness.    No vision or hearing change.     Periods are regular, can be heavy.    Not on birth control.  Nonsmoker, no alcohol.   Attends church, has a good support group there.  Saw a headache specialist as a teenager for migraines.  Headaches and the anxiety can be together or separate.  Sometimes has cravings for ice.   Not taking iron.     No family hx/o sleep apnea.  No prior sleep study.  Drinks 1 caffeine beverage daily, doesn't skip meals.  Often wakes up several times per night.     Wants FMLA paperwork done.    4 days in February missed.  Has left early 4-5 days.  .......................  Past Medical History:  Diagnosis Date  . Edema 2014   intermittent since 2014, diuretic use prior  . Gestational diabetes 2002  . Migraine   . Strabismus   . Ulcer ([place])   . Wears glasses    Current Outpatient Prescriptions on File Prior to Visit  Medication Sig Dispense Refill  . ferrous gluconate (FERGON) 324 MG tablet Take 1 tablet (324 mg total) by mouth 3 (three) times daily with meals. (Patient not taking: Reported on 09/17/2016) 90 tablet 2  . SUMAtriptan (IMITREX) 100 MG tablet May repeat in 2 hours if headache persists or recurs.Not to exceed 2 pills per day (Patient not taking: Reported on 09/17/2016) 10 tablet 1   No current facility-administered medications on file prior to visit.    ROS as in subjective   Objective: BP 118/72   Pulse (!) 106   Wt 298 lb 3.2 oz (135.3 kg)   SpO2 99%   BMI 49.62 kg/m   General appearance: alert, no distress, WD/WN, obese AA female HEENT: normocephalic, sclerae anicteric, PERRLA, EOMi, nares patent, no discharge or erythema, pharynx normal  Oral cavity: MMM, no lesions Neck: supple, no lymphadenopathy, no thyromegaly, no masses Heart: RRR, normal S1, S2, no murmurs Lungs: CTA bilaterally, no wheezes, rhonchi, or rales Extremities: no edema, no cyanosis, no clubbing Pulses: 2+ symmetric, upper and lower extremities, normal cap refill Neurological: alert, oriented x 3, CN2-12 intact, strength normal upper extremities and lower extremities, sensation normal throughout, DTRs 2+ throughout, no cerebellar signs, gait normal Psychiatric: normal affect, behavior normal, pleasant     Assessment: Encounter Diagnoses  Name Primary?  . Migraine without status migrainosus, not intractable, unspecified migraine type Yes  . Anxiety   . History of anemia   . Snoring   . Impaired fasting blood sugar   . Menstrual migraine without status migrainosus, not intractable       Plan: Discussed possible causes of headaches.  She has hx/o anemia, there is risk factors for OSA, she has significant stressors.   Advised not skipping meals, limit caffeine, work on good sleep hygiene.  Reduce stress where possible.  Discussed possible causes of anxiety, including stress, possible anemia as her hemoglobin has been low in the past.  Labs today, consider sleep study.  Will complete FMLA.   Use Imitrex prn for migraine.  Discussed risks/benefits and proper use of medication.   Aneli was seen today for headaches, panic attacks.  Diagnoses and all orders for this visit:  Migraine without status migrainosus, not intractable, unspecified migraine type -     Basic metabolic panel -     CBC with Differential/Platelet -     TSH -     Hemoglobin A1c -     Iron and TIBC  Anxiety -     Basic metabolic panel -     CBC with Differential/Platelet -     TSH -     Hemoglobin A1c -     Iron and TIBC  History of anemia -     Basic metabolic panel -     CBC with Differential/Platelet -     TSH -     Hemoglobin A1c -     Iron and TIBC  Snoring -     Basic metabolic panel -     CBC with Differential/Platelet -     TSH -     Hemoglobin A1c -     Iron and TIBC  Impaired fasting blood sugar -     Basic metabolic panel -     CBC with Differential/Platelet -     TSH -     Hemoglobin A1c -     Iron and TIBC  Menstrual migraine without status migrainosus, not intractable -     SUMAtriptan (IMITREX) 100 MG tablet; May repeat in 2 hours if headache persists or recurs.Not to exceed 2 pills per day

## 2016-09-18 LAB — HEMOGLOBIN A1C
HEMOGLOBIN A1C: 5.7 % — AB (ref <5.7)
Mean Plasma Glucose: 117 mg/dL

## 2016-09-20 ENCOUNTER — Other Ambulatory Visit: Payer: Self-pay | Admitting: Medical

## 2016-09-20 MED ORDER — FERROUS GLUCONATE 324 (38 FE) MG PO TABS: 324.0000 mg | ORAL_TABLET | Freq: Three times a day (TID) | ORAL | 2 refills | Status: DC

## 2016-09-21 ENCOUNTER — Encounter: Payer: Self-pay | Admitting: Internal Medicine

## 2016-09-27 ENCOUNTER — Telehealth: Payer: Self-pay | Admitting: Medical

## 2016-09-27 NOTE — Telephone Encounter (Signed)
Pt called and stated that her employer had rejected FMLA stating that question #6 was not answered. Sending copy to Louretta Shorten to be reviewed.

## 2016-09-28 NOTE — Telephone Encounter (Signed)
Gave to Sherri Lindsey to complete .

## 2016-11-25 DIAGNOSIS — R030 Elevated blood-pressure reading, without diagnosis of hypertension: Secondary | ICD-10-CM | POA: Diagnosis not present

## 2016-11-25 DIAGNOSIS — J302 Other seasonal allergic rhinitis: Secondary | ICD-10-CM | POA: Diagnosis not present

## 2017-05-12 DIAGNOSIS — M79605 Pain in left leg: Secondary | ICD-10-CM | POA: Diagnosis not present

## 2017-05-12 DIAGNOSIS — R079 Chest pain, unspecified: Secondary | ICD-10-CM | POA: Diagnosis not present

## 2017-05-12 DIAGNOSIS — R350 Frequency of micturition: Secondary | ICD-10-CM | POA: Diagnosis not present

## 2017-05-12 DIAGNOSIS — Z841 Family history of disorders of kidney and ureter: Secondary | ICD-10-CM | POA: Diagnosis not present

## 2017-05-12 DIAGNOSIS — M545 Low back pain: Secondary | ICD-10-CM | POA: Diagnosis not present

## 2017-05-12 DIAGNOSIS — M546 Pain in thoracic spine: Secondary | ICD-10-CM | POA: Diagnosis not present

## 2018-02-28 ENCOUNTER — Ambulatory Visit (INDEPENDENT_AMBULATORY_CARE_PROVIDER_SITE_OTHER): Payer: BLUE CROSS/BLUE SHIELD | Admitting: Physician Assistant

## 2018-02-28 ENCOUNTER — Encounter: Payer: Self-pay | Admitting: Physician Assistant

## 2018-02-28 VITALS — BP 120/82 | HR 94 | Temp 98.8°F | Resp 16 | Ht 64.0 in | Wt 286.0 lb

## 2018-02-28 DIAGNOSIS — Z124 Encounter for screening for malignant neoplasm of cervix: Secondary | ICD-10-CM | POA: Diagnosis not present

## 2018-02-28 DIAGNOSIS — B3731 Acute candidiasis of vulva and vagina: Secondary | ICD-10-CM

## 2018-02-28 DIAGNOSIS — N898 Other specified noninflammatory disorders of vagina: Secondary | ICD-10-CM

## 2018-02-28 DIAGNOSIS — Z113 Encounter for screening for infections with a predominantly sexual mode of transmission: Secondary | ICD-10-CM | POA: Diagnosis not present

## 2018-02-28 DIAGNOSIS — B373 Candidiasis of vulva and vagina: Secondary | ICD-10-CM

## 2018-02-28 DIAGNOSIS — Z30011 Encounter for initial prescription of contraceptive pills: Secondary | ICD-10-CM

## 2018-02-28 LAB — POCT WET + KOH PREP: Trich by wet prep: ABSENT

## 2018-02-28 LAB — POCT URINALYSIS DIP (MANUAL ENTRY)
Bilirubin, UA: NEGATIVE
Blood, UA: NEGATIVE
Glucose, UA: NEGATIVE mg/dL
Ketones, POC UA: NEGATIVE mg/dL
Nitrite, UA: NEGATIVE
Protein Ur, POC: NEGATIVE mg/dL
Spec Grav, UA: 1.025 (ref 1.010–1.025)
Urobilinogen, UA: 1 U/dL
pH, UA: 6 (ref 5.0–8.0)

## 2018-02-28 LAB — POCT URINE PREGNANCY: Preg Test, Ur: NEGATIVE

## 2018-02-28 MED ORDER — FLUCONAZOLE 150 MG PO TABS: 150.0000 mg | ORAL_TABLET | Freq: Once | ORAL | 1 refills | Status: AC

## 2018-02-28 MED ORDER — LEVONORGESTREL-ETHINYL ESTRAD 0.1-20 MG-MCG PO TABS: 1.0000 | ORAL_TABLET | Freq: Every day | ORAL | 11 refills | Status: DC

## 2018-02-28 NOTE — Progress Notes (Signed)
Sherri Lindsey  MRN: [id number] DOB: 07-10-1976  PCP: Carlena Hurl, PA-C  Subjective:  Pt is a 42 year old female who presents to clinic for vaginal itching and discharge x 3 days.  Starting to get cramps, discharge and itching before her periods for the past three months. "Ever since I turned 51".  Vaginal discharge is [place], no odor, occasionally is like "cottage cheese".  Last yeast infection was last month - not diagnosed "that's just how it felt". Went away after about 4 days.  Menses is supposed to start in 6 days.   New sexual partner since Feb. No condom use. Not on birth control - she is interested in starting back on ocps.  Not utd PAP - never had an abnormal PAP.   Review of Systems  Constitutional: Negative for chills, fatigue and fever.  Respiratory: Negative for cough, shortness of breath and wheezing.   Cardiovascular: Negative for chest pain and palpitations.  Gastrointestinal: Negative for abdominal pain, diarrhea, nausea and vomiting.  Genitourinary: Positive for vaginal discharge and vaginal pain ("itching"). Negative for decreased urine volume, difficulty urinating, dysuria, enuresis, flank pain, frequency, hematuria and urgency.  Musculoskeletal: Negative for back pain.  Neurological: Negative for dizziness, weakness, light-headedness and headaches.    Patient Active Problem List   Diagnosis Date Noted  . Impaired fasting blood sugar 09/17/2016  . Snoring 09/17/2016  . History of anemia 09/17/2016  . Anxiety 09/17/2016  . Migraine without status migrainosus, not intractable 02/24/2015    Current Outpatient Medications on File Prior to Visit  Medication Sig Dispense Refill  . ferrous gluconate (FERGON) 324 MG tablet Take 1 tablet (324 mg total) by mouth 3 (three) times daily with meals. 90 tablet 2  . SUMAtriptan (IMITREX) 100 MG tablet May repeat in 2 hours if headache persists or recurs.Not to exceed 2 pills per day 10 tablet 1   No current  facility-administered medications on file prior to visit.     No Known Allergies   Objective:  BP 120/82 (BP Location: Left Arm, Patient Position: Sitting, Cuff Size: Large)   Pulse 94   Temp 98.8 F (37.1 C) (Oral)   Resp 16   Ht 5\' 4"  (1.626 m)   Wt 286 lb (129.7 kg)   LMP 02/07/2018   SpO2 97%   BMI 49.09 kg/m   Physical Exam  Constitutional: She is oriented to person, place, and time. No distress.  Cardiovascular: Normal rate, regular rhythm and normal heart sounds.  Genitourinary: Cervix exhibits no motion tenderness and no discharge. Vaginal discharge ([place], curd-like discharge. ) found.  Neurological: She is alert and oriented to person, place, and time.  Skin: Skin is warm and dry.  Psychiatric: Judgment normal.  Vitals reviewed.   Results for orders placed or performed in visit on 02/28/18  POCT urine pregnancy  Result Value Ref Range   Preg Test, Ur Negative Negative  POCT Wet + KOH Prep  Result Value Ref Range   Yeast by KOH Present (A) Absent   Yeast by wet prep Present (A) Absent   WBC by wet prep Few Few   Clue Cells Wet Prep HPF POC Few (A) None   Trich by wet prep Absent Absent   Bacteria Wet Prep HPF POC Many (A) Few   Epithelial Cells By [employer] Pref ([facility]) Moderate (A) None, Few, Too numerous to count   RBC,UR,HPF,POC None None RBC/hpf  POCT urinalysis dipstick  Result Value Ref Range   Color, UA yellow yellow  Clarity, UA cloudy (A) clear   Glucose, UA negative negative mg/dL   Bilirubin, UA negative negative   Ketones, POC UA negative negative mg/dL   Spec Grav, UA 1.025 1.010 - 1.025   Blood, UA negative negative   pH, UA 6.0 5.0 - 8.0   Protein Ur, POC negative negative mg/dL   Urobilinogen, UA 1.0 0.2 or 1.0 E.U./dL   Nitrite, UA Negative Negative   Leukocytes, UA Small (1+) (A) Negative    Assessment and Plan :  1. Yeast vaginitis - fluconazole (DIFLUCAN) 150 MG tablet; Take 1 tablet (150 mg total) by mouth once for 1 dose. Repeat if  needed  Dispense: 2 tablet; Refill: 1  2. Vaginal discharge - POCT Wet + KOH Prep - POCT urinalysis dipstick - Urine Culture  3. Screen for STD (sexually transmitted disease) - HIV antibody - RPR  4. Screening for cervical cancer - Pap IG, CT/NG NAA, and HPV (high risk)  5. Encounter for initial prescription of contraceptive pills - negative pregnancy test. Okay to start ocps.  - POCT urine pregnancy - levonorgestrel-ethinyl estradiol (AVIANE) 0.1-20 MG-MCG tablet; Take 1 tablet by mouth daily.  Dispense: 1 Package; Refill: 11   Whitney Evian Derringer, PA-C  [facility] 02/28/2018 1:55 PM  Please note: Portions of this report may have been transcribed using dragon voice recognition software. Every effort was made to ensure accuracy; however, inadvertent computerized transcription errors may be present.

## 2018-02-28 NOTE — Patient Instructions (Addendum)
You have a yeast infection. Take one dose of diflucan. You can take another if you are not feeling improvement after 72 hours. Be sure you are getting probiotics and prebiotics.  Avoid douching.  No sign of a urinary tract infection.  You are not pregnant.   Start taking Aviane birth control pills. Start on the first Sunday following your period (or read package insert for other starting times).   If you have lab work done today you will be contacted with your lab results within the next 2 weeks.  If you have not heard from [place] then please contact us. The fastest way to get your results is to register for My Chart.   Vaginal Yeast infection, Adult Vaginal yeast infection is a condition that causes soreness, swelling, and redness (inflammation) of the vagina. It also causes vaginal discharge. This is a common condition. Some women get this infection frequently. What are the causes? This condition is caused by a change in the normal balance of the yeast (candida) and bacteria that live in the vagina. This change causes an overgrowth of yeast, which causes the inflammation. What increases the risk? This condition is more likely to develop in:  Women who take antibiotic medicines.  Women who have diabetes.  Women who take birth control pills.  Women who are pregnant.  Women who douche often.  Women who have a weak defense (immune) system.  Women who have been taking steroid medicines for a long time.  Women who frequently wear tight clothing.  What are the signs or symptoms? Symptoms of this condition include:  [place], thick vaginal discharge.  Swelling, itching, redness, and irritation of the vagina. The lips of the vagina (vulva) may be affected as well.  Pain or a burning feeling while urinating.  Pain during sex.  How is this diagnosed? This condition is diagnosed with a medical history and physical exam. This will include a pelvic exam. Your health care provider will  examine a sample of your vaginal discharge under a microscope. Your health care provider may send this sample for testing to confirm the diagnosis. How is this treated? This condition is treated with medicine. Medicines may be over-the-counter or prescription. You may be told to use one or more of the following:  Medicine that is taken orally.  Medicine that is applied as a cream.  Medicine that is inserted directly into the vagina (suppository).  Follow these instructions at home:  Take or apply over-the-counter and prescription medicines only as told by your health care provider.  Do not have sex until your health care provider has approved. Tell your sex partner that you have a yeast infection. That person should go to his or her health care provider if he or she develops symptoms.  Do not wear tight clothes, such as pantyhose or tight pants.  Avoid using tampons until your health care provider approves.  Eat more yogurt. This may help to keep your yeast infection from returning.  Try taking a sitz bath to help with discomfort. This is a warm water bath that is taken while you are sitting down. The water should only come up to your hips and should cover your buttocks. Do this 3-4 times per day or as told by your health care provider.  Do not douche.  Wear breathable, cotton underwear.  If you have diabetes, keep your blood sugar levels under control. Contact a health care provider if:  You have a fever.  Your symptoms go away  and then return.  Your symptoms do not get better with treatment.  Your symptoms get worse.  You have new symptoms.  You develop blisters in or around your vagina.  You have blood coming from your vagina and it is not your menstrual period.  You develop pain in your abdomen. This information is not intended to replace advice given to you by your health care provider. Make sure you discuss any questions you have with your health care  provider. Document Released: 04/07/2005 Document Revised: 12/10/2015 Document Reviewed: 12/30/2014 Elsevier Interactive Patient Education  2018 [employer].  IF you received an x-ray today, you will receive an invoice from [facility]. Please contact [facility] at [phone] with questions or concerns regarding your invoice.   IF you received labwork today, you will receive an invoice from [place]. Please contact [facility] at [phone] with questions or concerns regarding your invoice.   Our billing staff will not be able to assist you with questions regarding bills from these companies.  You will be contacted with the lab results as soon as they are available. The fastest way to get your results is to activate your My Chart account. Instructions are located on the last page of this paperwork. If you have not heard from [place] regarding the results in 2 weeks, please contact this office.

## 2018-03-01 LAB — RPR: RPR Ser Ql: NONREACTIVE

## 2018-03-01 LAB — HIV ANTIBODY (ROUTINE TESTING W REFLEX): HIV Screen 4th Generation wRfx: NONREACTIVE

## 2018-03-01 LAB — URINE CULTURE

## 2018-03-05 NOTE — Progress Notes (Signed)
Negative HIV and RPR. No UTI. Results released to mychart.

## 2018-03-06 LAB — PAP IG, CT-NG NAA, HPV HIGH-RISK
Chlamydia, Nuc. Acid Amp: NEGATIVE
Gonococcus by Nucleic Acid Amp: NEGATIVE
HPV, high-risk: NEGATIVE
PAP Smear Comment: 0

## 2018-07-01 ENCOUNTER — Ambulatory Visit: Payer: BLUE CROSS/BLUE SHIELD | Admitting: Emergency Medicine

## 2018-07-01 ENCOUNTER — Ambulatory Visit: Payer: BLUE CROSS/BLUE SHIELD | Admitting: Family Medicine

## 2018-08-21 DIAGNOSIS — M545 Low back pain: Secondary | ICD-10-CM | POA: Diagnosis not present

## 2018-09-04 DIAGNOSIS — M545 Low back pain: Secondary | ICD-10-CM | POA: Diagnosis not present

## 2019-04-11 DIAGNOSIS — F339 Major depressive disorder, recurrent, unspecified: Secondary | ICD-10-CM | POA: Diagnosis not present

## 2019-05-03 DIAGNOSIS — Z79899 Other long term (current) drug therapy: Secondary | ICD-10-CM | POA: Diagnosis not present

## 2019-05-03 DIAGNOSIS — M549 Dorsalgia, unspecified: Secondary | ICD-10-CM | POA: Diagnosis not present

## 2019-05-03 DIAGNOSIS — M542 Cervicalgia: Secondary | ICD-10-CM | POA: Diagnosis not present

## 2019-05-03 DIAGNOSIS — M545 Low back pain: Secondary | ICD-10-CM | POA: Diagnosis not present

## 2020-08-01 DIAGNOSIS — U071 COVID-19: Secondary | ICD-10-CM | POA: Diagnosis not present

## 2020-09-03 ENCOUNTER — Other Ambulatory Visit: Payer: Self-pay | Admitting: Obstetrics & Gynecology

## 2020-09-03 DIAGNOSIS — R69 Illness, unspecified: Secondary | ICD-10-CM | POA: Diagnosis not present

## 2020-09-03 DIAGNOSIS — N920 Excessive and frequent menstruation with regular cycle: Secondary | ICD-10-CM

## 2020-09-03 DIAGNOSIS — E669 Obesity, unspecified: Secondary | ICD-10-CM | POA: Diagnosis not present

## 2020-09-03 DIAGNOSIS — Z01419 Encounter for gynecological examination (general) (routine) without abnormal findings: Secondary | ICD-10-CM | POA: Diagnosis not present

## 2020-09-03 DIAGNOSIS — Z113 Encounter for screening for infections with a predominantly sexual mode of transmission: Secondary | ICD-10-CM | POA: Diagnosis not present

## 2020-09-11 ENCOUNTER — Ambulatory Visit (INDEPENDENT_AMBULATORY_CARE_PROVIDER_SITE_OTHER): Payer: BLUE CROSS/BLUE SHIELD | Admitting: Bariatrics

## 2020-09-15 ENCOUNTER — Ambulatory Visit
Admission: RE | Admit: 2020-09-15 | Discharge: 2020-09-15 | Disposition: A | Discharge status: Home / Self Care | Payer: No Typology Code available for payment source | Source: Ambulatory Visit | Attending: Obstetrics & Gynecology | Admitting: Obstetrics & Gynecology

## 2020-09-15 DIAGNOSIS — N920 Excessive and frequent menstruation with regular cycle: Secondary | ICD-10-CM | POA: Diagnosis not present

## 2020-09-15 DIAGNOSIS — D259 Leiomyoma of uterus, unspecified: Secondary | ICD-10-CM | POA: Diagnosis not present

## 2020-09-25 ENCOUNTER — Ambulatory Visit (INDEPENDENT_AMBULATORY_CARE_PROVIDER_SITE_OTHER): Payer: BLUE CROSS/BLUE SHIELD | Admitting: Bariatrics

## 2020-11-24 DIAGNOSIS — Z1329 Encounter for screening for other suspected endocrine disorder: Secondary | ICD-10-CM | POA: Diagnosis not present

## 2020-11-24 DIAGNOSIS — D649 Anemia, unspecified: Secondary | ICD-10-CM | POA: Diagnosis not present

## 2020-11-24 DIAGNOSIS — Z13220 Encounter for screening for lipoid disorders: Secondary | ICD-10-CM | POA: Diagnosis not present

## 2020-11-24 DIAGNOSIS — R69 Illness, unspecified: Secondary | ICD-10-CM | POA: Diagnosis not present

## 2020-11-24 DIAGNOSIS — F419 Anxiety disorder, unspecified: Secondary | ICD-10-CM | POA: Diagnosis not present

## 2020-11-26 DIAGNOSIS — D509 Iron deficiency anemia, unspecified: Secondary | ICD-10-CM | POA: Diagnosis not present

## 2020-11-26 DIAGNOSIS — D649 Anemia, unspecified: Secondary | ICD-10-CM | POA: Diagnosis not present

## 2020-11-28 DIAGNOSIS — D509 Iron deficiency anemia, unspecified: Secondary | ICD-10-CM | POA: Diagnosis not present

## 2020-12-01 DIAGNOSIS — D509 Iron deficiency anemia, unspecified: Secondary | ICD-10-CM | POA: Diagnosis not present

## 2020-12-03 DIAGNOSIS — D509 Iron deficiency anemia, unspecified: Secondary | ICD-10-CM | POA: Diagnosis not present

## 2020-12-05 DIAGNOSIS — D509 Iron deficiency anemia, unspecified: Secondary | ICD-10-CM | POA: Diagnosis not present

## 2020-12-10 DIAGNOSIS — D509 Iron deficiency anemia, unspecified: Secondary | ICD-10-CM | POA: Diagnosis not present

## 2020-12-17 DIAGNOSIS — M5441 Lumbago with sciatica, right side: Secondary | ICD-10-CM | POA: Diagnosis not present

## 2020-12-17 DIAGNOSIS — R69 Illness, unspecified: Secondary | ICD-10-CM | POA: Diagnosis not present

## 2020-12-17 DIAGNOSIS — R030 Elevated blood-pressure reading, without diagnosis of hypertension: Secondary | ICD-10-CM | POA: Diagnosis not present

## 2020-12-24 DIAGNOSIS — M5417 Radiculopathy, lumbosacral region: Secondary | ICD-10-CM | POA: Diagnosis not present

## 2020-12-30 DIAGNOSIS — M5116 Intervertebral disc disorders with radiculopathy, lumbar region: Secondary | ICD-10-CM | POA: Diagnosis not present

## 2020-12-30 DIAGNOSIS — M25551 Pain in right hip: Secondary | ICD-10-CM | POA: Diagnosis not present

## 2020-12-30 DIAGNOSIS — M9903 Segmental and somatic dysfunction of lumbar region: Secondary | ICD-10-CM | POA: Diagnosis not present

## 2020-12-30 DIAGNOSIS — M9905 Segmental and somatic dysfunction of pelvic region: Secondary | ICD-10-CM | POA: Diagnosis not present

## 2021-01-01 DIAGNOSIS — M25551 Pain in right hip: Secondary | ICD-10-CM | POA: Diagnosis not present

## 2021-01-01 DIAGNOSIS — M9903 Segmental and somatic dysfunction of lumbar region: Secondary | ICD-10-CM | POA: Diagnosis not present

## 2021-01-01 DIAGNOSIS — M9905 Segmental and somatic dysfunction of pelvic region: Secondary | ICD-10-CM | POA: Diagnosis not present

## 2021-01-01 DIAGNOSIS — M5116 Intervertebral disc disorders with radiculopathy, lumbar region: Secondary | ICD-10-CM | POA: Diagnosis not present

## 2021-01-05 DIAGNOSIS — M5116 Intervertebral disc disorders with radiculopathy, lumbar region: Secondary | ICD-10-CM | POA: Diagnosis not present

## 2021-01-05 DIAGNOSIS — M25551 Pain in right hip: Secondary | ICD-10-CM | POA: Diagnosis not present

## 2021-01-05 DIAGNOSIS — M9905 Segmental and somatic dysfunction of pelvic region: Secondary | ICD-10-CM | POA: Diagnosis not present

## 2021-01-05 DIAGNOSIS — M9903 Segmental and somatic dysfunction of lumbar region: Secondary | ICD-10-CM | POA: Diagnosis not present

## 2021-05-06 DIAGNOSIS — M5136 Other intervertebral disc degeneration, lumbar region: Secondary | ICD-10-CM | POA: Diagnosis not present

## 2021-06-05 DIAGNOSIS — Z6841 Body Mass Index (BMI) 40.0 and over, adult: Secondary | ICD-10-CM | POA: Diagnosis not present

## 2021-06-05 DIAGNOSIS — Z131 Encounter for screening for diabetes mellitus: Secondary | ICD-10-CM | POA: Diagnosis not present

## 2021-06-05 DIAGNOSIS — Z713 Dietary counseling and surveillance: Secondary | ICD-10-CM | POA: Diagnosis not present

## 2021-06-05 DIAGNOSIS — Z13220 Encounter for screening for lipoid disorders: Secondary | ICD-10-CM | POA: Diagnosis not present

## 2021-10-16 DIAGNOSIS — H524 Presbyopia: Secondary | ICD-10-CM | POA: Diagnosis not present

## 2021-12-24 IMAGING — US US PELVIS COMPLETE WITH TRANSVAGINAL
1 series · 14 of 25 positions shown · non-contrast
Comparison: None

CLINICAL DATA: Frequent and painful menstruation.



[Series 1: us pelvis complete with transvaginal · 0.28mm/px · 14 of 42 slices shown]
[im 1/42]
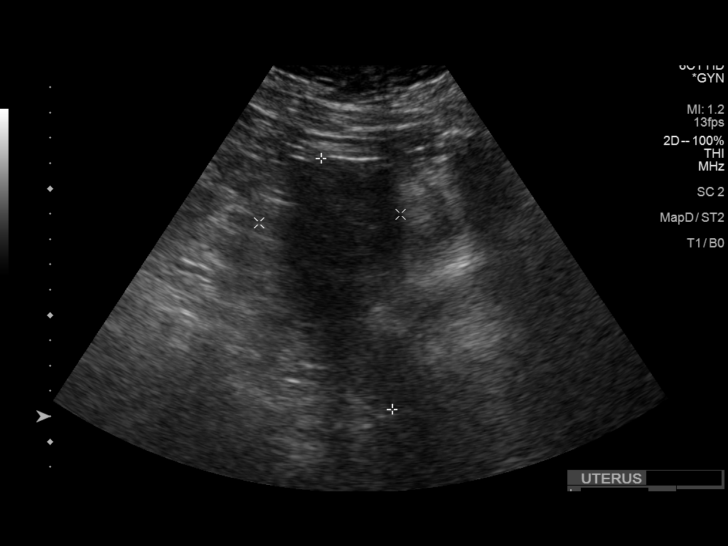
[im 4/42]
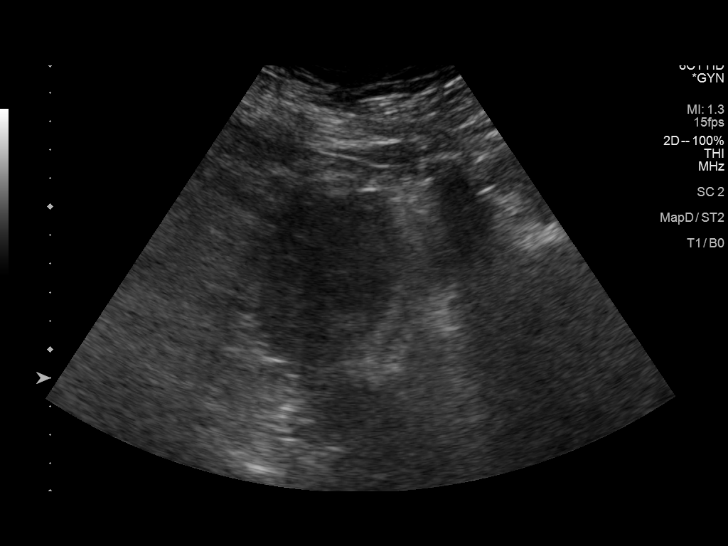
[im 7/42]
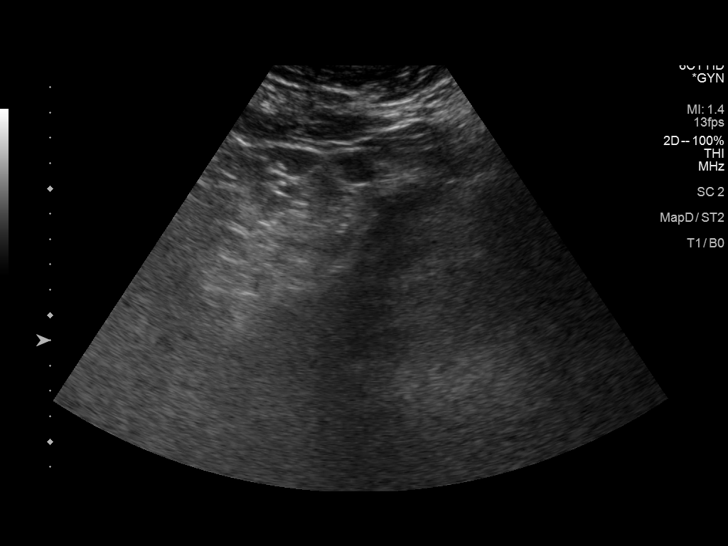
[im 11/42]
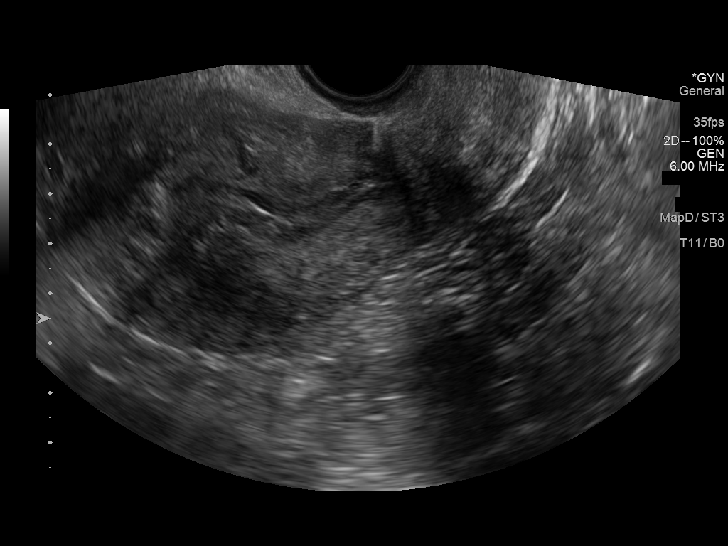
[im 14/42]
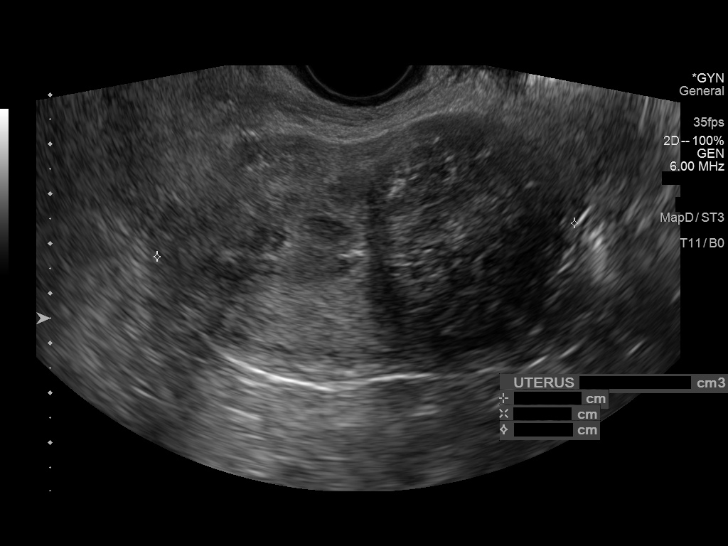
[im 16/42]
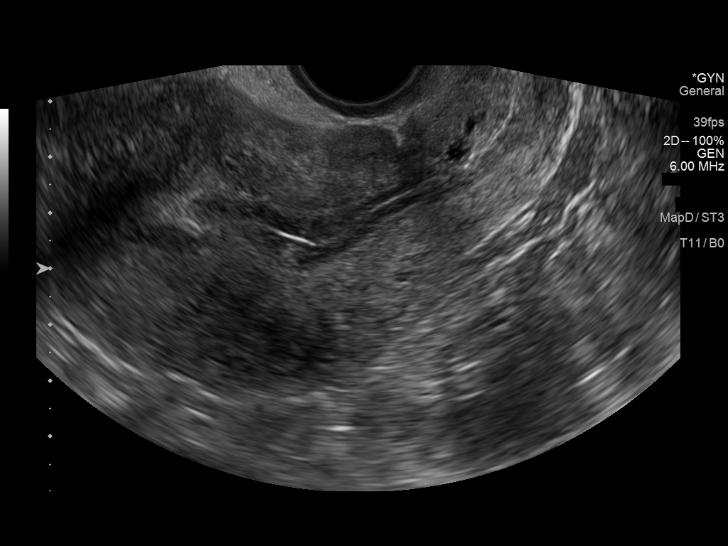
[im 19/42]
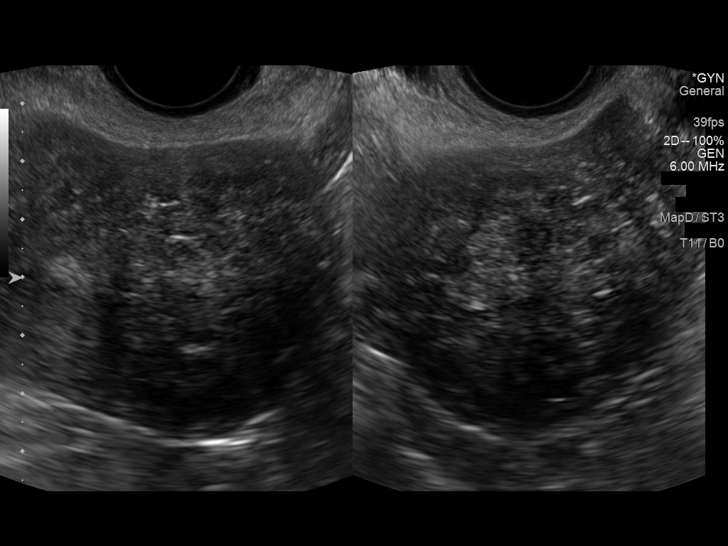
[im 23/42]
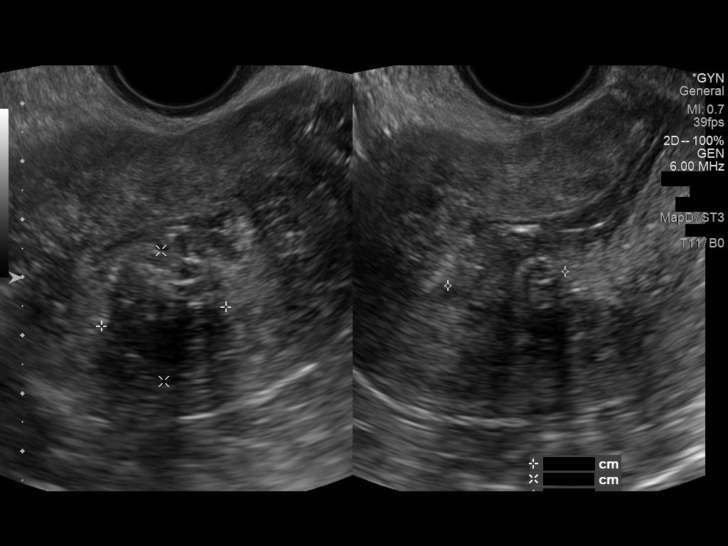
[im 26/42]
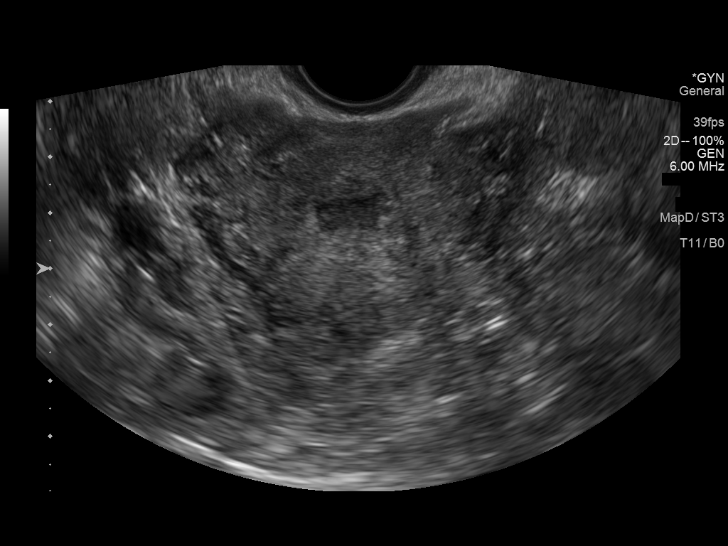
[im 28/42]
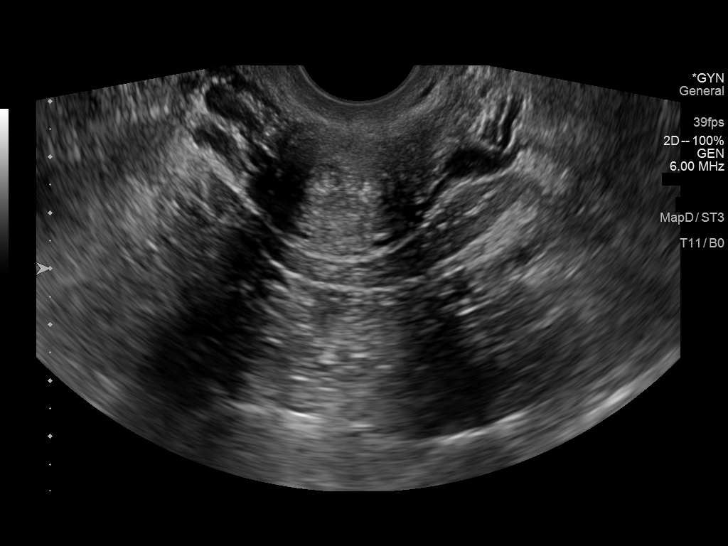
[im 31/42]
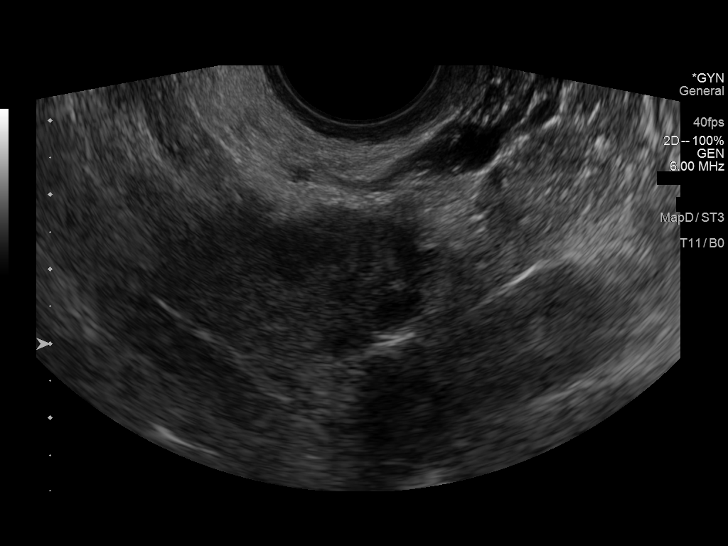
[im 35/42]
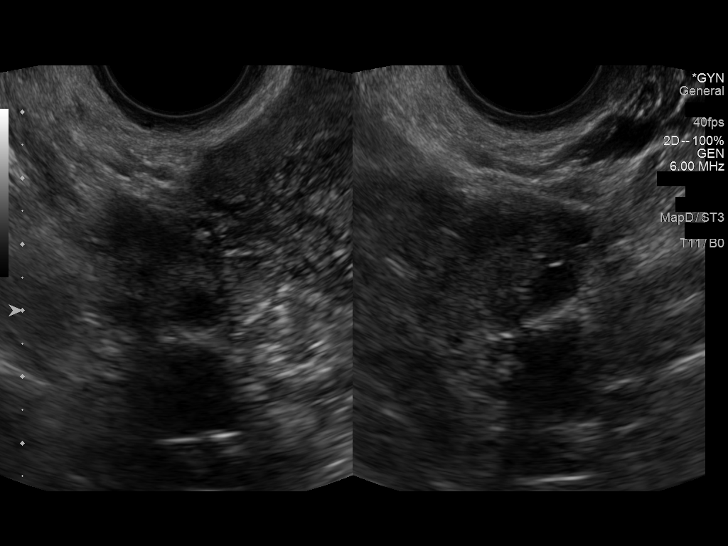
[im 38/42]
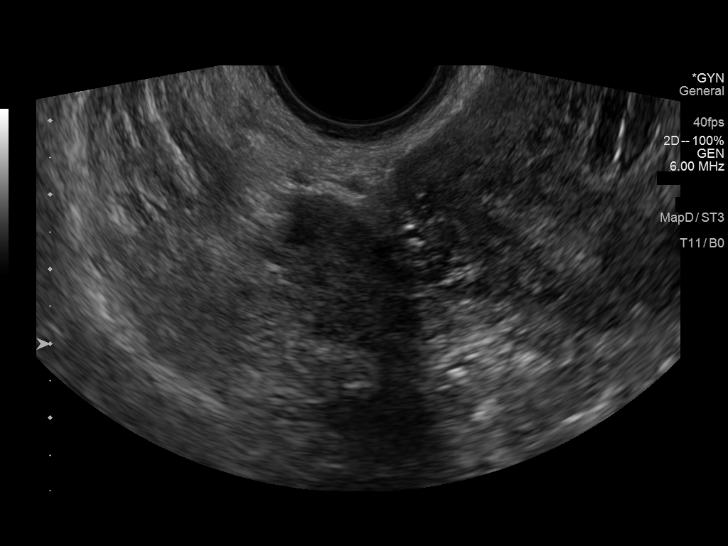
[im 42/42]
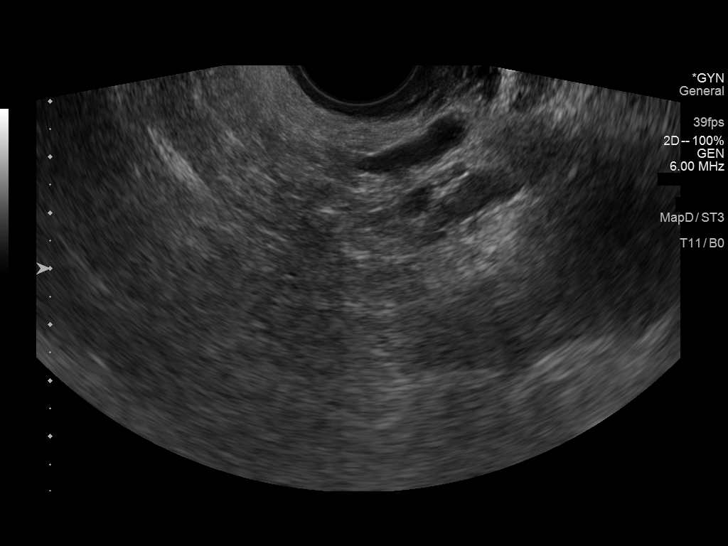

[14 of 25 positions shown; findings below may reference images not displayed]

FINDINGS: Uterus

Measurements: 10.4 x 8.4 x 5.8 cm = volume: 265 mL. 4.8 cm fibroid
is noted anteriorly in the fundus. 2.3 cm fibroid is noted
posteriorly.

Endometrium

Thickness: 11 mm which is within normal limits. No focal abnormality
visualized.

Right ovary

Measurements: 2.7 x 2.4 x 2.2 cm = volume: 7 mL. Normal
appearance/no adnexal mass.

Left ovary

Not visualized.

Other findings

No abnormal free fluid.
IMPRESSION: Two uterine fibroids are noted, the largest measuring 4.8 cm. Left
ovary is not visualized. No other abnormality seen in the pelvis.

## 2022-02-17 ENCOUNTER — Encounter (INDEPENDENT_AMBULATORY_CARE_PROVIDER_SITE_OTHER): Payer: Self-pay

## 2022-03-17 ENCOUNTER — Encounter: Payer: Self-pay | Admitting: Internal Medicine

## 2022-04-20 ENCOUNTER — Encounter: Payer: Self-pay | Admitting: Internal Medicine

## 2022-08-23 ENCOUNTER — Ambulatory Visit: Payer: Self-pay | Admitting: *Deleted

## 2022-08-23 NOTE — Telephone Encounter (Signed)
Reason for Disposition  [1] SEVERE abdominal pain AND [2] present > 1 hour  Answer Assessment - Initial Assessment Questions 1. ONSET: "When did the pain begin?"      Started 1 month ago 2. LOCATION: "Where does it hurt?" (upper, mid or lower back)    R Lower back pain through buttock to knee 3. SEVERITY: "How bad is the pain?"  (e.g., Scale 1-10; mild, moderate, or severe)   - MILD (1-3): Doesn't interfere with normal activities.    - MODERATE (4-7): Interferes with normal activities or awakens from sleep.    - SEVERE (8-10): Excruciating pain, unable to do any normal activities.      severe 4. PATTERN: "Is the pain constant?" (e.g., yes, no; constant, intermittent)      Off/on 5. RADIATION: "Does the pain shoot into your legs or somewhere else?"     Goes down R side 6. CAUSE:  "What do you think is causing the back pain?"      Sciatic nerve disorder 2 years ago 7. BACK OVERUSE:  "Any recent lifting of heavy objects, strenuous work or exercise?"     Patient [place] the mall this weekend 8. MEDICINES: "What have you taken so far for the pain?" (e.g., nothing, acetaminophen, NSAIDS)     Advil 9. NEUROLOGIC SYMPTOMS: "Do you have any weakness, numbness, or problems with bowel/bladder control?"     no 10. OTHER SYMPTOMS: "Do you have any other symptoms?" (e.g., fever, abdomen pain, burning with urination, blood in urine)       no  Protocols used: Back Pain-A-AH

## 2022-08-23 NOTE — Telephone Encounter (Signed)
  Chief Complaint: back pain Symptoms: severe back pain, radiates into buttock and leg Frequency: 1 month- worse over weekend Pertinent Negatives: Patient denies fever, abdomen pain, burning with urination, blood in urine Disposition: '[x]'$ ED /'[]'$ Urgent Care (no appt availability in office) / '[]'$ Appointment(In office/virtual)/ '[]'$  [facility]/ '[]'$ Home Care/ '[]'$ Refused Recommended Disposition /'[]'$ [facility]/ '[]'$  Follow-up with PCP Additional Notes: Patient was scheduled for NP appointment- Wednesday- advised ED/Ortho UC for severe pain per protocol

## 2022-08-23 NOTE — Progress Notes (Shared)
Subjective:    Sherri Lindsey is a 47 y.o. female and is here for a comprehensive physical exam.  HPI  Health Maintenance Due  Topic Date Due   COVID-19 Vaccine (1) Never done   Hepatitis C Screening  Never done   DTaP/Tdap/Td (1 - Tdap) Never done   PAP SMEAR-Modifier  02/28/2021   INFLUENZA VACCINE  Never done    Acute Concerns: ***  Chronic Issues: Edema   Migraines   Health Maintenance: Immunizations -- Due for flu, tetanus, Covid-19 vaccines. Colonoscopy -- N/A Mammogram -- *** PAP -- Last completed 02/28/18. Negative for intraepithelial lesion or malignancy. Recommended repeat in 2022. Bone Density -- N/A Diet -- *** Exercise -- ***  Sleep habits -- *** Mood -- ***  UTD with dentist? - *** UTD with eye doctor? - ***  Weight history: Wt Readings from Last 10 Encounters:  02/28/18 286 lb (129.7 kg)  09/17/16 298 lb 3.2 oz (135.3 kg)  07/17/15 295 lb 9.6 oz (134.1 kg)  08/19/14 288 lb (130.6 kg)  02/14/13 286 lb (129.7 kg)   There is no height or weight on file to calculate BMI. No LMP recorded.  Alcohol use:  reports no history of alcohol use.  Tobacco use:  Tobacco Use: Low Risk  (07/25/2020)   Patient History    Smoking Tobacco Use: Never    Smokeless Tobacco Use: Never    Passive Exposure: Not on file   Eligible for lung cancer screening? ***     09/17/2016    1:40 PM  Depression screen PHQ 2/9  Decreased Interest 0  Down, Depressed, Hopeless 0  PHQ - 2 Score 0  Altered sleeping 2  Tired, decreased energy 2  Change in appetite 0  Trouble concentrating 0  Moving slowly or fidgety/restless 0  Suicidal thoughts 0  PHQ-9 Score 4     Other providers/specialists: Patient Care Team: Tysinger, Leward Quan as PCP - General (Family Medicine)    PMHx, SurgHx, SocialHx, Medications, and Allergies were reviewed in the Visit Navigator and updated as appropriate.   Past Medical History:  Diagnosis Date   Edema 2014   intermittent since  2014, diuretic use prior   Gestational diabetes 2002   Migraine    Strabismus    Ulcer    Wears glasses      Past Surgical History:  Procedure Laterality Date   CHOLECYSTECTOMY     KNEE SURGERY     arthroscopic   TONSILLECTOMY       Family History  Problem Relation Age of Onset   Hypertension Mother    Diabetes Mother    Hypertension Father    Kidney failure Father    Hyperlipidemia Father    Hypertension Sister    Kidney failure Sister    Hypertension Brother    Hypertension Maternal Grandmother    Heart disease Neg Hx     Social History   Tobacco Use   Smoking status: Never   Smokeless tobacco: Never  Substance Use Topics   Alcohol use: No   Drug use: No    Review of Systems:   ROS  Objective:   There were no vitals taken for this visit. There is no height or weight on file to calculate BMI.   General Appearance:    Alert, cooperative, no distress, appears stated age  Head:    Normocephalic, without obvious abnormality, atraumatic  Eyes:    PERRL, conjunctiva/corneas clear, EOM's intact, fundi    benign, both  eyes  Ears:    Normal TM's and external ear canals, both ears  Nose:   Nares normal, septum midline, mucosa normal, no drainage    or sinus tenderness  Throat:   Lips, mucosa, and tongue normal; teeth and gums normal  Neck:   Supple, symmetrical, trachea midline, no adenopathy;    thyroid:  no enlargement/tenderness/nodules; no carotid   bruit or JVD  Back:     Symmetric, no curvature, ROM normal, no CVA tenderness  Lungs:     Clear to auscultation bilaterally, respirations unlabored  Chest Wall:    No tenderness or deformity   Heart:    Regular rate and rhythm, S1 and S2 normal, no murmur, rub or gallop  Breast Exam:    ***No tenderness, masses, or nipple abnormality  Abdomen:     Soft, non-tender, bowel sounds active all four quadrants,    no masses, no organomegaly  Genitalia:    ***Normal female without lesion, discharge or tenderness   Extremities:   Extremities normal, atraumatic, no cyanosis or edema  Pulses:   2+ and symmetric all extremities  Skin:   Skin color, texture, turgor normal, no rashes or lesions  Lymph nodes:   Cervical, supraclavicular, and axillary nodes normal  Neurologic:   CNII-XII intact, normal strength, sensation and reflexes    throughout    Assessment/Plan:   There are no diagnoses linked to this encounter.   Inda Coke, PA-C [place]

## 2022-08-25 ENCOUNTER — Telehealth: Payer: Self-pay | Admitting: Physician Assistant

## 2022-08-25 ENCOUNTER — Ambulatory Visit: Payer: No Typology Code available for payment source | Admitting: Physician Assistant

## 2022-08-25 ENCOUNTER — Encounter: Payer: Self-pay | Admitting: Physician Assistant

## 2022-08-25 ENCOUNTER — Telehealth: Payer: Self-pay | Admitting: *Deleted

## 2022-08-25 VITALS — BP 130/80 | HR 75 | Temp 97.7°F | Ht 63.5 in | Wt 278.0 lb

## 2022-08-25 DIAGNOSIS — Z8632 Personal history of gestational diabetes: Secondary | ICD-10-CM | POA: Diagnosis not present

## 2022-08-25 DIAGNOSIS — D500 Iron deficiency anemia secondary to blood loss (chronic): Secondary | ICD-10-CM | POA: Diagnosis not present

## 2022-08-25 DIAGNOSIS — R051 Acute cough: Secondary | ICD-10-CM

## 2022-08-25 DIAGNOSIS — Z124 Encounter for screening for malignant neoplasm of cervix: Secondary | ICD-10-CM

## 2022-08-25 DIAGNOSIS — M5441 Lumbago with sciatica, right side: Secondary | ICD-10-CM

## 2022-08-25 DIAGNOSIS — G43109 Migraine with aura, not intractable, without status migrainosus: Secondary | ICD-10-CM | POA: Diagnosis not present

## 2022-08-25 DIAGNOSIS — R12 Heartburn: Secondary | ICD-10-CM | POA: Diagnosis not present

## 2022-08-25 LAB — CBC WITH DIFFERENTIAL/PLATELET
Basophils Absolute: 0.1 10*3/uL (ref 0.0–0.1)
Basophils Relative: 1 % (ref 0.0–3.0)
Eosinophils Absolute: 0.1 10*3/uL (ref 0.0–0.7)
Eosinophils Relative: 0.8 % (ref 0.0–5.0)
HCT: 25.2 % — ABNORMAL LOW (ref 36.0–46.0)
Hemoglobin: 7.4 g/dL — CL (ref 12.0–15.0)
Lymphocytes Relative: 36.6 % (ref 12.0–46.0)
Lymphs Abs: 2.3 10*3/uL (ref 0.7–4.0)
MCHC: 29.2 g/dL — ABNORMAL LOW (ref 30.0–36.0)
MCV: 57 fl — ABNORMAL LOW (ref 78.0–100.0)
Monocytes Absolute: 0.5 10*3/uL (ref 0.1–1.0)
Monocytes Relative: 8.1 % (ref 3.0–12.0)
Neutro Abs: 3.4 10*3/uL (ref 1.4–7.7)
Neutrophils Relative %: 53.5 % (ref 43.0–77.0)
Platelets: 337 10*3/uL (ref 150.0–400.0)
RBC: 4.42 Mil/uL (ref 3.87–5.11)
RDW: 21 % — ABNORMAL HIGH (ref 11.5–15.5)
WBC: 6.3 10*3/uL (ref 4.0–10.5)

## 2022-08-25 LAB — IBC + FERRITIN
Ferritin: 3.1 ng/mL — ABNORMAL LOW (ref 10.0–291.0)
Iron: 16 ug/dL — ABNORMAL LOW (ref 42–145)
Saturation Ratios: 3.3 % — ABNORMAL LOW (ref 20.0–50.0)
TIBC: 481.6 ug/dL — ABNORMAL HIGH (ref 250.0–450.0)
Transferrin: 344 mg/dL (ref 212.0–360.0)

## 2022-08-25 LAB — H. PYLORI ANTIBODY, IGG: H Pylori IgG: NEGATIVE

## 2022-08-25 LAB — VITAMIN B12: Vitamin B-12: 218 pg/mL (ref 211–911)

## 2022-08-25 NOTE — Telephone Encounter (Signed)
CRITICAL VALUE STICKER  CRITICAL VALUE: Hemoglobin 7.4  RECEIVER (on-site recipient of call): Anselmo Pickler, LPN  DATE & TIME NOTIFIED: 08/25/2022 at 3:37 PM  MESSENGER (representative from lab): Delorise Jackson from Lab  MD NOTIFIED: Inda Coke, PA-C  TIME OF NOTIFICATION:3:38 PM  RESPONSE:  Inda Coke notified verbally and message sent to her.

## 2022-08-25 NOTE — Progress Notes (Unsigned)
    Sherri Lindsey D.Sherri [place] Phone: [phone]   Assessment and Plan:     There are no diagnoses linked to this encounter.  ***   Pertinent previous records reviewed include ***   Follow Up: ***     Subjective:   I, Sherri Lindsey, am serving as a Education administrator for Sherri Lindsey  Chief Complaint: low back pain   HPI:  08/26/2022 Patient is a 47 year old female complaining of low back pain. Patient states   Relevant Historical Information: ***  Additional pertinent review of systems negative.   Current Outpatient Medications:    ibuprofen (ADVIL) 200 MG tablet, Take 400-600 mg by mouth every 6 (six) hours as needed for headache or moderate pain., Disp: , Rfl:    Objective:     There were no vitals filed for this visit.    There is no height or weight on file to calculate BMI.    Physical Exam:    ***   Electronically signed by:  Sherri Lindsey D.[facility] 4:11 PM 08/25/22

## 2022-08-25 NOTE — Progress Notes (Signed)
Sherri Lindsey is a 47 y.o. female here for a new patient visit.  History of Present Illness:   Chief Complaint  Patient presents with   Establish Care   Back Pain    Pt c/o lower back pain on right side radiating all the way down to knee, 1 month. Has been taking Advil no relief.   Heartburn    Pt c/o heartburn x 1 month, has tried TUMS no relief.    H/o Gestational Diabetes No medication needed at the time and reports no elevated HGBA1C > 6.5% Most recent A1c from 06/13/22 was 5.8%  Anemia Chronic hx of this due to heavy periods and poor po intake Was seeing hematology in May 2022 with [facility] in [facility] She reports that she has history of multiple iron transfusions during that time Last infusion in June 2022 She has not been taking any oral iron since  Migraines with Aura Associated with menses. Treats with Advil at home prn but not regularly Has never seen neurology  Heartburn Brought on with certain foods/drinks like coffee. Takes Tums to treat but this is not effective Symptoms seem to be worsening with time  Lower Back Pain Reports pain is frequent. Reports severe flare-up one month ago during a shower, in which she was experiencing pain and numbness extending from her arm down to her leg. Denies vision changes during the episode, loss of balance, severe HA, chest pain, SOB or increased swelling in legs afterward. Experienced associated headache and tinnitus for the rest of that day. Has been experiencing continued lower back pain and weakness on right leg. 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 Denies: hx of smoking, hemoptysis, treatment tried for cough  Requesting referral for gynecologist.    Past Medical History:  Diagnosis Date   Edema 2014   intermittent since 2014, diuretic use prior   Gestational  diabetes 2002   Migraine    Strabismus    Ulcer    Wears glasses      Social History   Tobacco Use   Smoking status: Never   Smokeless tobacco: Never  Vaping Use   Vaping Use: Never used  Substance Use Topics   Alcohol use: Yes    Comment: rarely   Drug use: No    Past Surgical History:  Procedure Laterality Date   CESAREAN SECTION     2002, 2005   CHOLECYSTECTOMY     KNEE SURGERY     arthroscopic   TONSILLECTOMY      Family History  Problem Relation Age of Onset   Hypertension Mother    Diabetes Mother    Hypertension Father    Kidney failure Father    Hyperlipidemia Father    Hypertension Sister    Kidney failure Sister    Hypertension Brother    Hypertension Maternal Grandmother    Heart disease Neg Hx     No Known Allergies  Current Medications:   Current Outpatient Medications:    ibuprofen (ADVIL) 200 MG tablet, Take 400-600 mg by mouth every 6 (six) hours as needed for headache or moderate pain., Disp: , Rfl:    Review of Systems:   Review of Systems  Constitutional:  Negative for fever and malaise/fatigue.  HENT:  Negative for congestion.   Eyes:  Negative for blurred vision.  Respiratory:  Positive for cough (Intermittent). Negative for shortness of breath.  Cardiovascular:  Positive for leg swelling. Negative for chest pain and palpitations.  Gastrointestinal:  Positive for heartburn. Negative for vomiting.  Musculoskeletal:  Positive for back pain (Lower back, frequent).  Skin:  Negative for rash.  Neurological:  Negative for loss of consciousness and headaches.    Vitals:   Vitals:   08/25/22 1113  BP: 130/80  Pulse: 75  Temp: 97.7 F (36.5 C)  TempSrc: Temporal  SpO2: 97%  Weight: 278 lb (126.1 kg)  Height: 5' 3.5" (1.613 m)     Body mass index is 48.47 kg/m.  Physical Exam:   Physical Exam Vitals and nursing note reviewed.  Constitutional:      General: She is not in acute distress.    Appearance: She is  well-developed. She is not ill-appearing or toxic-appearing.  Cardiovascular:     Rate and Rhythm: Normal rate and regular rhythm.     Pulses: Normal pulses.     Heart sounds: Normal heart sounds, S1 normal and S2 normal.  Pulmonary:     Effort: Pulmonary effort is normal.     Breath sounds: Normal breath sounds.  Musculoskeletal:     Comments: Decreased ROM 2/2 pain with flexion/extension, lateral side bends, and rotation. Reproducible tenderness with deep palpation to right lumbar paraspinal muscles. No bony tenderness.   Antalgic gait  Skin:    General: Skin is warm and dry.  Neurological:     General: No focal deficit present.     Mental Status: She is alert.     GCS: GCS eye subscore is 4. GCS verbal subscore is 5. GCS motor subscore is 6.     Cranial Nerves: Cranial nerves 2-12 are intact.     Sensory: Sensation is intact.     Motor: Motor function is intact.     Coordination: Coordination is intact.     Gait: Gait is intact.     Comments: RLE with 4/5 weakness  Psychiatric:        Speech: Speech normal.        Behavior: Behavior normal. Behavior is cooperative.     Assessment and Plan:   History of gestational diabetes Reviewed chart Will defer updated A1c to next visit  Migraine with aura and without status migrainosus, not intractable Overall controlled Continue current management Neuro exam WNL  Heartburn Uncontrolled Trial OTC pepcid or prilosec for symptoms Follow-up in 1 month  Iron deficiency anemia due to chronic blood loss Update blood work and likely refer to hematology accordingly  Pap smear for cervical cancer screening Referral to gyn  Acute right-sided low back pain with right-sided sciatica Unclear etiology, possible pinched nerve Referral to sports medicine to coordinate best imaging and work-up Trial oral diclofenac prn - advised to continue to take acid reducing medication while on this and avoid additional NSAIDs If new/worsening sx,  needs to go to the ER Neuro exam unremarkable except for weakness of RLE  Acute cough No red flags Trial acid reducing medication  Follow-up in 1 month to reassess, sooner if concerns  I,Alexander Ruley,acting as a scribe for [employer], PA.,have documented all relevant documentation on the behalf of Inda Coke, PA,as directed by  Inda Coke, PA while in the presence of Inda Coke, [place].  I, Inda Coke, [place], have reviewed all documentation for this visit. The documentation on 08/25/22 for the exam, diagnosis, procedures, and orders are all accurate and complete.  Time spent with patient today was 50 minutes which consisted of chart review, discussing diagnosis,  work up, treatment answering questions and documentation.  Inda Coke, PA-C

## 2022-08-25 NOTE — Patient Instructions (Addendum)
It was great to see you!  Let's follow-up in 1 month to discuss your -- heartburn, coughing and follow-up on your leg pain  Start daily over the counter prilosec or nexium for your heartburn (generic is fine!)  I'm sending in oral diclofenac for your pain. Do not take additional ibuprofen while on this medication.  A referral has been placed for you to see one of our fantastic providers at [place]. Someone from their office will be in touch soon regarding scheduling your appointment.  Their location:  [place] at [facility]  [address] on the 1st floor Phone number [phone] Fax [phone].   This location is across the street from the entrance to [employer] and in the same complex as the [facility]  Take care,  Inda Coke PA-C

## 2022-08-25 NOTE — Telephone Encounter (Signed)
Patient states Pharmacy has not received RX for oral diclofenac.  Requests RX for oral diclofenac be sent to:   [facility] [place] [address] [place] Phone: [phone]  Fax: [phone]

## 2022-08-25 NOTE — Telephone Encounter (Signed)
Please see message. °

## 2022-08-26 ENCOUNTER — Ambulatory Visit (INDEPENDENT_AMBULATORY_CARE_PROVIDER_SITE_OTHER): Payer: No Typology Code available for payment source

## 2022-08-26 ENCOUNTER — Telehealth: Payer: Self-pay | Admitting: Oncology

## 2022-08-26 ENCOUNTER — Ambulatory Visit: Payer: No Typology Code available for payment source | Admitting: Sports Medicine

## 2022-08-26 ENCOUNTER — Other Ambulatory Visit: Payer: Self-pay | Admitting: Physician Assistant

## 2022-08-26 ENCOUNTER — Other Ambulatory Visit: Payer: Self-pay

## 2022-08-26 VITALS — BP 138/84 | HR 90 | Ht 63.0 in | Wt 278.0 lb

## 2022-08-26 DIAGNOSIS — G8929 Other chronic pain: Secondary | ICD-10-CM

## 2022-08-26 DIAGNOSIS — M5441 Lumbago with sciatica, right side: Secondary | ICD-10-CM

## 2022-08-26 DIAGNOSIS — M5431 Sciatica, right side: Secondary | ICD-10-CM

## 2022-08-26 DIAGNOSIS — D649 Anemia, unspecified: Secondary | ICD-10-CM

## 2022-08-26 MED ORDER — MELOXICAM 15 MG PO TABS: 15.0000 mg | ORAL_TABLET | Freq: Every day | ORAL | 0 refills | Status: DC

## 2022-08-26 MED ORDER — CYCLOBENZAPRINE HCL 5 MG PO TABS: 5.0000 mg | ORAL_TABLET | Freq: Three times a day (TID) | ORAL | 0 refills | Status: DC | PRN

## 2022-08-26 MED ORDER — DICLOFENAC SODIUM 75 MG PO TBEC: 75.0000 mg | DELAYED_RELEASE_TABLET | Freq: Two times a day (BID) | ORAL | 0 refills | Status: DC

## 2022-08-26 NOTE — Telephone Encounter (Signed)
Scheduled appt per 2/15 referral. Pt is aware of appt date and time. Pt is aware to arrive 15 mins prior to appt time and to bring and updated insurance card. Pt is aware of appt location.   °

## 2022-08-26 NOTE — Patient Instructions (Addendum)
Good to see you  - Start meloxicam 15 mg daily x2 weeks.  If still having pain after 2 weeks, complete 3rd-week of meloxicam. May use remaining meloxicam as needed once daily for pain control.  Do not to use additional NSAIDs while taking meloxicam.  May use Tylenol [phone] mg 2 to 3 times a day for breakthrough pain. Low back HEP Flexeril 5-10 mg nightly as needed for muscle spasm PT referral  Xrays on the way out  3-4 week follow up

## 2022-08-27 ENCOUNTER — Inpatient Hospital Stay: Payer: No Typology Code available for payment source | Attending: Oncology | Admitting: Oncology

## 2022-08-27 ENCOUNTER — Encounter: Payer: Self-pay | Admitting: Oncology

## 2022-08-27 ENCOUNTER — Inpatient Hospital Stay: Payer: No Typology Code available for payment source

## 2022-08-27 ENCOUNTER — Other Ambulatory Visit: Payer: Self-pay

## 2022-08-27 VITALS — BP 152/98 | HR 89 | Temp 97.6°F | Resp 20 | Wt 282.0 lb

## 2022-08-27 DIAGNOSIS — N938 Other specified abnormal uterine and vaginal bleeding: Secondary | ICD-10-CM | POA: Insufficient documentation

## 2022-08-27 DIAGNOSIS — R0609 Other forms of dyspnea: Secondary | ICD-10-CM | POA: Insufficient documentation

## 2022-08-27 DIAGNOSIS — Z8249 Family history of ischemic heart disease and other diseases of the circulatory system: Secondary | ICD-10-CM | POA: Diagnosis not present

## 2022-08-27 DIAGNOSIS — Z9049 Acquired absence of other specified parts of digestive tract: Secondary | ICD-10-CM | POA: Insufficient documentation

## 2022-08-27 DIAGNOSIS — M545 Low back pain, unspecified: Secondary | ICD-10-CM | POA: Diagnosis not present

## 2022-08-27 DIAGNOSIS — D539 Nutritional anemia, unspecified: Secondary | ICD-10-CM

## 2022-08-27 DIAGNOSIS — F5089 Other specified eating disorder: Secondary | ICD-10-CM | POA: Insufficient documentation

## 2022-08-27 DIAGNOSIS — D649 Anemia, unspecified: Secondary | ICD-10-CM | POA: Insufficient documentation

## 2022-08-27 DIAGNOSIS — Z833 Family history of diabetes mellitus: Secondary | ICD-10-CM | POA: Diagnosis not present

## 2022-08-27 DIAGNOSIS — M2578 Osteophyte, vertebrae: Secondary | ICD-10-CM | POA: Insufficient documentation

## 2022-08-27 DIAGNOSIS — K921 Melena: Secondary | ICD-10-CM | POA: Insufficient documentation

## 2022-08-27 DIAGNOSIS — K5900 Constipation, unspecified: Secondary | ICD-10-CM | POA: Diagnosis not present

## 2022-08-27 DIAGNOSIS — Z8349 Family history of other endocrine, nutritional and metabolic diseases: Secondary | ICD-10-CM | POA: Diagnosis not present

## 2022-08-27 DIAGNOSIS — Z79899 Other long term (current) drug therapy: Secondary | ICD-10-CM | POA: Insufficient documentation

## 2022-08-27 DIAGNOSIS — M791 Myalgia, unspecified site: Secondary | ICD-10-CM | POA: Insufficient documentation

## 2022-08-27 DIAGNOSIS — Z841 Family history of disorders of kidney and ureter: Secondary | ICD-10-CM | POA: Diagnosis not present

## 2022-08-27 DIAGNOSIS — R5383 Other fatigue: Secondary | ICD-10-CM | POA: Diagnosis not present

## 2022-08-27 DIAGNOSIS — R232 Flushing: Secondary | ICD-10-CM | POA: Diagnosis not present

## 2022-08-27 DIAGNOSIS — D259 Leiomyoma of uterus, unspecified: Secondary | ICD-10-CM | POA: Insufficient documentation

## 2022-08-27 LAB — APTT: aPTT: 31 seconds (ref 24–36)

## 2022-08-27 LAB — CMP (CANCER CENTER ONLY)
ALT: 9 U/L (ref 0–44)
AST: 11 U/L — ABNORMAL LOW (ref 15–41)
Albumin: 4.1 g/dL (ref 3.5–5.0)
Alkaline Phosphatase: 68 U/L (ref 38–126)
Anion gap: 8 (ref 5–15)
BUN: 7 mg/dL (ref 6–20)
CO2: 24 mmol/L (ref 22–32)
Calcium: 9 mg/dL (ref 8.9–10.3)
Chloride: 103 mmol/L (ref 98–111)
Creatinine: 0.88 mg/dL (ref 0.44–1.00)
GFR, Estimated: >60 mL/min (ref >60)
Glucose, Bld: 142 mg/dL — ABNORMAL HIGH (ref 70–99)
Potassium: 3.7 mmol/L (ref 3.5–5.1)
Sodium: 135 mmol/L (ref 135–145)
Total Bilirubin: 0.7 mg/dL (ref 0.3–1.2)
Total Protein: 7.8 g/dL (ref 6.5–8.1)

## 2022-08-27 LAB — VITAMIN B12: Vitamin B-12: 209 pg/mL (ref 180–914)

## 2022-08-27 LAB — FIBRINOGEN: Fibrinogen: 475 mg/dL (ref 210–475)

## 2022-08-27 LAB — PROTIME-INR
INR: 1 (ref 0.8–1.2)
Prothrombin Time: 13.5 seconds (ref 11.4–15.2)

## 2022-08-27 LAB — FOLATE: Folate: 12 ng/mL (ref >5.9)

## 2022-08-27 NOTE — Progress Notes (Unsigned)
[facility] Initial Visit:  Patient Care Team: Inda Coke, [place] as PCP - General (Physician Assistant)  CHIEF COMPLAINTS/PURPOSE OF CONSULTATION:  HISTORY OF PRESENTING ILLNESS: Sherri Lindsey 47 y.o. female is here because of anemia Medical history notable for gestational diabetes, migraine, strabismus, low back pain, morbid obesity  September 15, 2020: Pelvic ultrasound notable for fibroid uterus  August 25, 2022: WBC 6.3 hemoglobin 7.4 MCV 57 platelet count 37; 54 segs 37 lymphs 8 monos 1 EO 1 basophil Ferritin 3.1 H. pylori IgG negative  August 27, 2022: [facility]  Patient is G2 P2 Menopause not reached.  Menses occur monthly and last 6 to 7 days and are regular.  Bleeding is heavy.  Does not have bleeding between periods.    Does no pass clots.  Last pregnancy was delivered by C section in 2008.  Patient has uterine fibroids.  Does not take oral iron regularly.  Has received IV iron in the past without complication.  Has not required PRBC's in the past.  Has constipation with oral iron.  Has a normal diet.  No history of postpartum hemorrhage or hemorrhage after surgery.  Occasional hematochezia.  No melena, hemoptysis, hematuria.  No history of intra-articular or soft tissue bleeding.  No history of abnormal bleeding in family members.  Patient has symptoms of fatigue, pallor,  DOE, decreased performance status.  Patient has pica to ice but not to starch/dirt.    To see Gynecology on September 24 2022.  Will arrange for IV iron and GI appointment   Review of Systems - Oncology  MEDICAL HISTORY: Past Medical History:  Diagnosis Date   Edema 2014   intermittent since 2014, diuretic use prior   Gestational diabetes 2002   Migraine    Strabismus    Ulcer    Wears glasses     SURGICAL HISTORY: Past Surgical History:  Procedure Laterality Date   CESAREAN SECTION     2002, 2005   CHOLECYSTECTOMY     KNEE SURGERY     arthroscopic    TONSILLECTOMY      SOCIAL HISTORY: Social History   Socioeconomic History   Marital status: Single    Spouse name: Not on file   Number of children: Not on file   Years of education: Not on file   Highest education level: Not on file  Occupational History   Not on file  Tobacco Use   Smoking status: Never   Smokeless tobacco: Never  Vaping Use   Vaping Use: Never used  Substance and Sexual Activity   Alcohol use: Yes    Comment: rarely   Drug use: No   Sexual activity: Not Currently  Other Topics Concern   Not on file  Social History Narrative   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, mom is currently independent   Social Determinants of Health   Financial Resource Strain: Low Risk  (08/27/2022)   Overall Financial Resource Strain (CARDIA)    Difficulty of Paying Living Expenses: Not hard at all  Food Insecurity: No Food Insecurity (08/27/2022)   Hunger Vital Sign    Worried About Running Out of Food in the Last Year: Never true    [place] in the Last Year: Never true  Transportation Needs: No Transportation Needs (08/27/2022)   PRAPARE - Hydrologist (Medical): No    Lack of Transportation (Non-Medical): No  Physical  Activity: Not on file  Stress: Not on file  Social Connections: Not on file  Intimate Partner Violence: Not At Risk (08/27/2022)   Humiliation, Afraid, Rape, and Kick questionnaire    Fear of Current or Ex-Partner: No    Emotionally Abused: No    Physically Abused: No    Sexually Abused: No    FAMILY HISTORY Family History  Problem Relation Age of Onset   Hypertension Mother    Diabetes Mother    Hypertension Father    Kidney failure Father    Hyperlipidemia Father    Hypertension Sister    Kidney failure Sister    Hypertension Brother    Hypertension Maternal Grandmother    Heart disease Neg Hx     ALLERGIES:  has No Known Allergies.  MEDICATIONS:  Current Outpatient  Medications  Medication Sig Dispense Refill   cyclobenzaprine (FLEXERIL) 5 MG tablet Take 1 tablet (5 mg total) by mouth 3 (three) times daily as needed for muscle spasms. 30 tablet 0   diclofenac (VOLTAREN) 75 MG EC tablet Take 1 tablet (75 mg total) by mouth 2 (two) times daily. 30 tablet 0   ibuprofen (ADVIL) 200 MG tablet Take 400-600 mg by mouth every 6 (six) hours as needed for headache or moderate pain.     meloxicam (MOBIC) 15 MG tablet Take 1 tablet (15 mg total) by mouth daily. 30 tablet 0   No current facility-administered medications for this visit.    PHYSICAL EXAMINATION:  ECOG PERFORMANCE STATUS: {CHL ONC ECOG [id number]   Vitals:   08/27/22 1101  BP: (!) 152/98  Pulse: 89  Resp: 20  Temp: 97.6 F (36.4 C)  SpO2: 100%    Filed Weights   08/27/22 1101  Weight: 282 lb (127.9 kg)     Physical Exam   LABORATORY DATA: I have personally reviewed the data as listed:  Office Visit on 08/25/2022  Component Date Value Ref Range Status   WBC 08/25/2022 6.3  4.0 - 10.5 K/uL Final   RBC 08/25/2022 4.42  3.87 - 5.11 Mil/uL Final   Hemoglobin 08/25/2022 7.4 Repeated and verified X2. (LL)  12.0 - 15.0 g/dL Final   HCT 08/25/2022 25.2 (L)  36.0 - 46.0 % Final   MCV 08/25/2022 57.0 (L)  78.0 - 100.0 fl Corrected   Rechecked and verified result.   MCHC 08/25/2022 29.2 (L)  30.0 - 36.0 g/dL Final   RDW 08/25/2022 21.0 (H)  11.5 - 15.5 % Final   Platelets 08/25/2022 337.0  150.0 - 400.0 K/uL Final   Neutrophils Relative % 08/25/2022 53.5  43.0 - 77.0 % Final   Lymphocytes Relative 08/25/2022 36.6  12.0 - 46.0 % Final   Monocytes Relative 08/25/2022 8.1  3.0 - 12.0 % Final   Eosinophils Relative 08/25/2022 0.8  0.0 - 5.0 % Final   Basophils Relative 08/25/2022 1.0  0.0 - 3.0 % Final   Neutro Abs 08/25/2022 3.4  1.4 - 7.7 K/uL Final   Lymphs Abs 08/25/2022 2.3  0.7 - 4.0 K/uL Final   Monocytes Absolute 08/25/2022 0.5  0.1 - 1.0 K/uL Final   Eosinophils Absolute  08/25/2022 0.1  0.0 - 0.7 K/uL Final   Basophils Absolute 08/25/2022 0.1  0.0 - 0.1 K/uL Final   Iron 08/25/2022 16 (L)  42 - 145 ug/dL Final   Transferrin 08/25/2022 344.0  212.0 - 360.0 mg/dL Final   Saturation Ratios 08/25/2022 3.3 (L)  20.0 - 50.0 % Final   Ferritin 08/25/2022 3.1 (  L)  10.0 - 291.0 ng/mL Final   TIBC 08/25/2022 481.6 (H)  250.0 - 450.0 mcg/dL Final   H Pylori IgG 08/25/2022 Negative  Negative Final   Vitamin B-12 08/25/2022 218  211 - 911 pg/mL Final    RADIOGRAPHIC STUDIES: I have personally reviewed the radiological images as listed and agree with the findings in the report  DG Lumbar Spine 2-3 Views  Result Date: 08/26/2022 CLINICAL DATA:  Low back pain radiating to the right lower extremity for 1 month. EXAM: LUMBAR SPINE - 2-3 VIEW COMPARISON:  None Available. FINDINGS: There is no evidence of lumbar spine fracture. Minimal curvature of lower lumbar spine. Moderate to severe degenerative joint changes with narrow intervertebral space osteophyte formation identified at L3-4. Mild degenerative joint changes at L4-5 and L5-S1 with narrowed joint space and osteophyte formation are noted. 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 Electronically Signed   By: Abelardo Diesel M.D.   On: 08/26/2022 08:35    ASSESSMENT/PLAN Cancer Staging  No matching staging information was found for the patient.   No problem-specific Assessment & Plan notes found for this encounter.   No orders of the defined types were placed in this encounter.   All questions were answered. The patient knows to call the clinic with any problems, questions or concerns.  This note was electronically signed.    Barbee Cough, MD  08/27/2022 11:17 AM

## 2022-08-28 LAB — VON WILLEBRAND PANEL
Coagulation Factor VIII: 266 % — ABNORMAL HIGH (ref 56–140)
Ristocetin Co-factor, Plasma: 96 % (ref 50–200)
Von Willebrand Antigen, Plasma: 180 % (ref 50–200)

## 2022-08-28 LAB — COAG STUDIES INTERP REPORT

## 2022-08-29 LAB — HAPTOGLOBIN: Haptoglobin: 198 mg/dL (ref 42–296)

## 2022-09-01 LAB — HGB FRACTIONATION CASCADE
Hgb A2: 1.8 % (ref 1.8–3.2)
Hgb A: 98.2 % (ref 96.4–98.8)
Hgb F: 0 % (ref 0.0–2.0)
Hgb S: 0 %

## 2022-09-02 ENCOUNTER — Encounter: Payer: Self-pay | Admitting: Oncology

## 2022-09-02 NOTE — Addendum Note (Signed)
Addended by: Wanita Chamberlain on: 09/02/2022 11:47 AM   Modules accepted: Orders

## 2022-09-03 ENCOUNTER — Inpatient Hospital Stay: Payer: No Typology Code available for payment source

## 2022-09-03 ENCOUNTER — Other Ambulatory Visit: Payer: Self-pay

## 2022-09-03 VITALS — BP 155/80 | HR 67 | Temp 98.7°F | Resp 18

## 2022-09-03 DIAGNOSIS — D539 Nutritional anemia, unspecified: Secondary | ICD-10-CM

## 2022-09-03 DIAGNOSIS — D649 Anemia, unspecified: Secondary | ICD-10-CM | POA: Diagnosis not present

## 2022-09-03 MED ORDER — SODIUM CHLORIDE 0.9 % IV SOLN: 200.0000 mg | Freq: Once | INTRAVENOUS | Status: DC

## 2022-09-03 MED ORDER — SODIUM CHLORIDE 0.9 % IV SOLN: Freq: Once | INTRAVENOUS | Status: AC

## 2022-09-03 MED ORDER — ACETAMINOPHEN 325 MG PO TABS
650.0000 mg | ORAL_TABLET | Freq: Once | ORAL | Status: AC
  Administered 2022-09-03: 650 mg via ORAL
  Filled 2022-09-03: qty 2

## 2022-09-03 MED ORDER — LORATADINE 10 MG PO TABS
10.0000 mg | ORAL_TABLET | Freq: Once | ORAL | Status: AC
  Administered 2022-09-03: 10 mg via ORAL
  Filled 2022-09-03: qty 1

## 2022-09-03 MED ORDER — SODIUM CHLORIDE 0.9 % IV SOLN
200.0000 mg | Freq: Once | INTRAVENOUS | Status: AC
  Administered 2022-09-03: 200 mg via INTRAVENOUS
  Filled 2022-09-03: qty 200

## 2022-09-03 NOTE — Patient Instructions (Signed)

## 2022-09-08 ENCOUNTER — Ambulatory Visit: Payer: No Typology Code available for payment source | Attending: Sports Medicine | Admitting: Physical Therapy

## 2022-09-08 ENCOUNTER — Encounter: Payer: Self-pay | Admitting: Physical Therapy

## 2022-09-08 ENCOUNTER — Other Ambulatory Visit: Payer: Self-pay

## 2022-09-08 DIAGNOSIS — G8929 Other chronic pain: Secondary | ICD-10-CM | POA: Insufficient documentation

## 2022-09-08 DIAGNOSIS — M6281 Muscle weakness (generalized): Secondary | ICD-10-CM | POA: Insufficient documentation

## 2022-09-08 DIAGNOSIS — R2689 Other abnormalities of gait and mobility: Secondary | ICD-10-CM | POA: Insufficient documentation

## 2022-09-08 DIAGNOSIS — M5431 Sciatica, right side: Secondary | ICD-10-CM | POA: Insufficient documentation

## 2022-09-08 DIAGNOSIS — M5441 Lumbago with sciatica, right side: Secondary | ICD-10-CM | POA: Diagnosis not present

## 2022-09-08 DIAGNOSIS — M5459 Other low back pain: Secondary | ICD-10-CM | POA: Diagnosis present

## 2022-09-08 NOTE — Therapy (Signed)
OUTPATIENT PHYSICAL THERAPY THORACOLUMBAR EVALUATION  Patient Name: Sherri Lindsey MRN: [id number] DOB:[birth year], 47 y.o., female Today's Date: 09/08/2022   PT End of Session - 09/08/22 1740     Visit Number 1    Number of Visits --   1-2x/week   Date for PT Re-Evaluation 11/03/22    Authorization Type [employer]    PT Start Time 1708    PT Stop Time 1742    PT Time Calculation (min) 34 min             Past Medical History:  Diagnosis Date   Edema 2014   intermittent since 2014, diuretic use prior   Gestational diabetes 2002   Migraine    Strabismus    Ulcer    Wears glasses    Past Surgical History:  Procedure Laterality Date   CESAREAN SECTION     2002, 2005   CHOLECYSTECTOMY     KNEE SURGERY     arthroscopic   TONSILLECTOMY     Patient Active Problem List   Diagnosis Date Noted   Deficiency anemia 08/27/2022   Dysfunctional uterine bleeding 08/27/2022   History of gestational diabetes 08/25/2022   Heartburn 08/25/2022   History of anemia 09/17/2016   Anxiety 09/17/2016    PCP: Inda Coke, PA  REFERRING PROVIDER: Inda Coke, PA  THERAPY DIAG:  Other low back pain  Muscle weakness  Other abnormalities of gait and mobility  REFERRING DIAG: Chronic right-sided low back pain with right-sided sciatica [M54.41, G89.29], Right sided sciatica [M54.31]   Rationale for Evaluation and Treatment:  Rehabilitation  SUBJECTIVE:  PERTINENT PAST HISTORY:  anemia        PRECAUTIONS: None  WEIGHT BEARING RESTRICTIONS No  FALLS:  Has patient fallen in last 6 months? No, Number of falls: 0  MOI/History of condition:  Onset date: 2 months.  She has had intermittent back pain in the past, but this episode is much mor severe.  SUBJECTIVE STATEMENT  Sherri Lindsey is a 47 y.o. female who presents to clinic with chief complaint of R sided back pain with radiating pain to foot at times.  She denies injury or trauma.  Denies BB changes.   Denies saddle  anesthesia.  She endorses n/t in the R anterior thigh.  From referring provider:   "1. Chronic right-sided low back pain with right-sided sciatica 2. Right sided sciatica -Chronic with exacerbation, initial sports medicine visit - Right-sided low back pain with radicular symptoms flaring for over 1 year, with consistent pain greater than 1 month that is consistent with right-sided sciatica.  Unclear if lumbar versus gluteal/piriformis origin - Start meloxicam 15 mg daily x2 weeks.  If still having pain after 2 weeks, complete 3rd-week of meloxicam. May use remaining meloxicam as needed once daily for pain control.  Do not to use additional NSAIDs while taking meloxicam.  May use Tylenol [phone] mg 2 to 3 times a day for breakthrough pain. - Start Flexeril 5 to 10 mg nightly as needed for muscle spasms - Start HEP and physical therapy for low back, glutes, sciatica - X-ray will be obtained today and can be reviewed with patient at follow-up visit - DG Lumbar Spine 2-3 Views; Future - Ambulatory referral to Physical Therapy"   Red flags:  denies BB changes and saddle anesthesia  Pain:  Are you having pain? 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  GENERAL OBSERVATION/GAIT:  Significant antalgic gait with L W/S during ambulation  SENSATION:  Light touch: Deficits R L2 - L3  LUMBAR AROM  AROM AROM  09/08/2022  Flexion  Fingertips to toes (WNL), w/ concordant pain; gowers sign  Extension limited by 50%, w/ concordant pain  Right lateral flexion WNL, w/ concordant pain  Left lateral flexion limited by >75%, w/ concordant pain  Right rotation limited by 25%, w/ concordant pain  Left rotation limited by 25%, w/ concordant pain    (Blank rows = not tested)   LE MMT:  MMT Right 09/08/2022 Left 09/08/2022  Hip flexion (L2, L3) 3* 4+  Knee extension (L3) 4* 4+  Knee flexion    Hip abduction    Hip extension    Hip external rotation    Hip internal rotation    Hip adduction    Ankle dorsiflexion (L4) c c  Ankle plantarflexion (S1) c c  Ankle inversion    Ankle eversion    Great Toe ext (L5) c c  Grossly     (Blank rows = not tested, score listed is out of 5 possible points.  N = WNL, D = diminished, C = clear for gross weakness with myotome testing, * = concordant pain with testing)   LUMBAR SPECIAL TESTS:  Straight leg raise: L (+ (crossed SLR)), R (+) Slump: L (-), R (+)  MUSCLE LENGTH: Hamstrings: Right  limited to 20 degrees d/t nerve pain  restriction; Left no restriction  Directional Preference: ext   LE ROM:  ROM Right 09/08/2022 Left 09/08/2022  Hip flexion    Hip extension    Hip abduction    Hip adduction    Hip internal rotation    Hip external rotation    Knee flexion    Knee extension    Ankle dorsiflexion    Ankle plantarflexion    Ankle inversion    Ankle eversion      (Blank rows = not tested, N = WNL, * = concordant pain with testing)  Functional Tests  Eval (09/08/2022)                                                                PALPATION:   TTP lower lumbar spine, sxs reduction with PA  PATIENT SURVEYS:  FOTO 59 -> 73   TODAY'S TREATMENT  Creating, reviewing, and completing below HEP  PATIENT EDUCATION:  POC, diagnosis, prognosis, HEP, and outcome measures.  Pt educated via explanation, demonstration, and handout (HEP).  Pt confirms  understanding verbally.   HOME EXERCISE PROGRAM: Access Code: [email or web address] URL: [email or web address] Date: 09/08/2022 Prepared by: Shearon Balo  Exercises - Prone Press Up On Elbows  - 6 x daily - 7 x weekly - 2 sets - 10 reps  Treatment priorities   Eval (09/08/2022)        Ext based  program        Manual PA mobilization        Neural mobs as able                          ASSESSMENT:  CLINICAL IMPRESSION: Sherri Lindsey is a 47 y.o. female who presents to clinic with signs and sxs consistent with R sided low back pain with R sided nerve root irritation.  Clear nerve root irritation on exam with + SLR, X SLR, Slump.  Responded well to ext based exercises and PA mobilizations.  OBJECTIVE IMPAIRMENTS: Pain, lumbar ROM  ACTIVITY LIMITATIONS: lifting, bending, working  PERSONAL FACTORS: See medical history and pertinent history   REHAB POTENTIAL: Good  CLINICAL DECISION MAKING: Stable/uncomplicated  EVALUATION COMPLEXITY: Low   GOALS:   SHORT TERM GOALS: Target date: 10/06/2022  Sherri Lindsey will be >75% HEP compliant to improve carryover between sessions and facilitate independent management of condition  Evaluation (09/08/2022): ongoing Goal status: INITIAL   LONG TERM GOALS: Target date: 11/03/2022  Sherri Lindsey will improve FOTO score to 73 as a proxy for functional improvement  Evaluation/Baseline (09/08/2022): 59 Goal status: INITIAL   2.  Sherri Lindsey will self report >/= 50% decrease in pain from evaluation   Evaluation/Baseline (09/08/2022): 10/10 max pain Goal status: INITIAL   3.  Sherri Lindsey will report confidence in self management of condition at time of discharge with advanced HEP  Evaluation/Baseline (09/08/2022): unable to self manage Goal status: INITIAL   PLAN: PT FREQUENCY: 1-2x/week  PT DURATION: 8 weeks (Ending 11/03/2022)  PLANNED INTERVENTIONS: Therapeutic exercises, Aquatic therapy, Therapeutic activity, Neuro Muscular re-education, Gait  training, Patient/Family education, Joint mobilization, Dry Needling, Electrical stimulation, Spinal mobilization and/or manipulation, Moist heat, Taping, Vasopneumatic device, Ionotophoresis '4mg'$ /ml Dexamethasone, and Manual therapy    Shearon Balo PT, DPT 09/08/2022, 5:49 PM

## 2022-09-15 NOTE — Progress Notes (Deleted)
    Benito Mccreedy D.[facility] Phone: [phone]   Assessment and Plan:     There are no diagnoses linked to this encounter.  ***   Pertinent previous records reviewed include ***   Follow Up: ***     Subjective:   I, Adore Kithcart, am serving as a Education administrator for Doctor Glennon Mac   Chief Complaint: low back pain    HPI:  08/26/2022 Patient is a 47 year old female complaining of low back pain. Patient states that she is having pain down her leg and glute, has pain sleeping, she works from home has three pillows on her chair and still has pain, heat and ice dont help, she not standing level feels like she is leaning to the side, pain for about a month , has had something like this before, advil for the pain and that does not help , some numbness but no tingling, states her side goes limp she feels like she has no control over her right side    09/16/2022 Patient states   Relevant Historical Information: None pertinent  Additional pertinent review of systems negative.   Current Outpatient Medications:    cyclobenzaprine (FLEXERIL) 5 MG tablet, Take 1 tablet (5 mg total) by mouth 3 (three) times daily as needed for muscle spasms., Disp: 30 tablet, Rfl: 0   diclofenac (VOLTAREN) 75 MG EC tablet, Take 1 tablet (75 mg total) by mouth 2 (two) times daily., Disp: 30 tablet, Rfl: 0   ibuprofen (ADVIL) 200 MG tablet, Take 400-600 mg by mouth every 6 (six) hours as needed for headache or moderate pain., Disp: , Rfl:    meloxicam (MOBIC) 15 MG tablet, Take 1 tablet (15 mg total) by mouth daily., Disp: 30 tablet, Rfl: 0   Objective:     There were no vitals filed for this visit.    There is no height or weight on file to calculate BMI.    Physical Exam:    ***   Electronically signed by:  Benito Mccreedy D.[facility] 8:36 AM 09/15/22

## 2022-09-16 ENCOUNTER — Ambulatory Visit: Payer: No Typology Code available for payment source | Admitting: Sports Medicine

## 2022-09-22 ENCOUNTER — Other Ambulatory Visit: Payer: Self-pay | Admitting: Sports Medicine

## 2022-09-22 ENCOUNTER — Encounter: Payer: No Typology Code available for payment source | Admitting: Physical Therapy

## 2022-09-22 NOTE — Therapy (Deleted)
OUTPATIENT PHYSICAL THERAPY TREATMENT NOTE   Patient Name: Sherri Lindsey MRN: [id number] DOB:1976/05/02, 47 y.o., female Today's Date: 09/22/2022  PCP: Marland Kitchen REFERRING PROVIDER: ***    Past Medical History:  Diagnosis Date   Edema 2014   intermittent since 2014, diuretic use prior   Gestational diabetes 2002   Migraine    Strabismus    Ulcer    Wears glasses    Past Surgical History:  Procedure Laterality Date   CESAREAN SECTION     2002, 2005   CHOLECYSTECTOMY     KNEE SURGERY     arthroscopic   TONSILLECTOMY     Patient Active Problem List   Diagnosis Date Noted   Deficiency anemia 08/27/2022   Dysfunctional uterine bleeding 08/27/2022   History of gestational diabetes 08/25/2022   Heartburn 08/25/2022   History of anemia 09/17/2016   Anxiety 09/17/2016    THERAPY DIAG:  No diagnosis found.   Rationale for Evaluation and Treatment Rehabilitation  REFERRING DIAG: Chronic right-sided low back pain with right-sided sciatica [M54.41, G89.29], Right sided sciatica [M54.31]    PERTINENT HISTORY: anemia     PRECAUTIONS/RESTRICTIONS:   none  SUBJECTIVE:  ***  Pain:  Are you having pain? 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   GENERAL OBSERVATION/GAIT:           Significant antalgic gait with L W/S during ambulation   SENSATION:          Light touch: Deficits R L2 - L3   LUMBAR AROM   AROM AROM  09/08/2022  Flexion Fingertips to toes (WNL), w/ concordant pain;  gowers sign  Extension limited by 50%, w/ concordant pain  Right lateral flexion WNL, w/ concordant pain  Left lateral flexion limited by >75%, w/ concordant pain  Right rotation limited by 25%, w/ concordant pain  Left rotation limited by 25%, w/ concordant pain    (Blank rows = not tested)    LE MMT:   MMT Right 09/08/2022 Left 09/08/2022  Hip flexion (L2, L3) 3* 4+  Knee extension (L3) 4* 4+  Knee flexion      Hip abduction      Hip extension      Hip external rotation      Hip internal rotation      Hip adduction      Ankle dorsiflexion (L4) c c  Ankle plantarflexion (S1) c c  Ankle inversion      Ankle eversion      Great Toe ext (L5) c c  Grossly        (Blank rows = not tested, score listed is out of 5 possible points.  N = WNL, D = diminished, C = clear for gross weakness with myotome testing, * = concordant pain with testing)    LUMBAR SPECIAL TESTS:  Straight leg raise: L (+ (crossed SLR)), R (+) Slump: L (-), R (+)   MUSCLE LENGTH: Hamstrings: Right  limited to 20 degrees d/t nerve pain  restriction; Left no restriction   Directional Preference: ext  LE ROM:   ROM Right 09/08/2022 Left 09/08/2022  Hip flexion      Hip extension      Hip abduction      Hip adduction      Hip internal rotation      Hip external rotation      Knee flexion      Knee extension      Ankle dorsiflexion      Ankle plantarflexion      Ankle inversion      Ankle eversion         (Blank rows = not tested, N = WNL, * = concordant pain with testing)   Functional Tests   Eval (09/08/2022)                                                                                                               PALPATION:            TTP lower lumbar spine, sxs reduction with PA   PATIENT SURVEYS:  FOTO 59 -> 73     TODAY'S TREATMENT  Creating, reviewing, and completing below HEP   PATIENT EDUCATION:  POC, diagnosis, prognosis, HEP, and outcome measures.  Pt  educated via explanation, demonstration, and handout (HEP).  Pt confirms understanding verbally.    HOME EXERCISE PROGRAM: Access Code: [email or web address] URL: [email or web address] Date: 09/08/2022 Prepared by: Shearon Balo   Exercises - Prone Press Up On Elbows  - 6 x daily - 7 x weekly - 2 sets - 10 reps   Treatment priorities     Eval (09/08/2022)              Ext based program              Manual PA mobilization              Neural mobs as able                                               TREATMENT ***:  Therapeutic Exercise: - ***  Manual Therapy: - PA mobilizations***  Neuromuscular re-ed: - ***  Therapeutic Activity: - ***  Self-care/Home Management: - ***  ASSESSMENT:   CLINICAL IMPRESSION: ***   OBJECTIVE IMPAIRMENTS: Pain, lumbar ROM   ACTIVITY LIMITATIONS: lifting, bending, working   PERSONAL FACTORS: See medical history and pertinent history     REHAB POTENTIAL: Good   CLINICAL DECISION MAKING: Stable/uncomplicated   EVALUATION COMPLEXITY: Low     GOALS:     SHORT TERM GOALS: Target date: 10/06/2022   Edie will be >75% HEP compliant to improve carryover between sessions and facilitate independent management of condition   Evaluation (09/08/2022): ongoing Goal status: INITIAL     LONG TERM GOALS: Target date: 11/03/2022   Adrija will improve FOTO score to 73 as a proxy for functional improvement   Evaluation/Baseline (09/08/2022): 59 Goal status: INITIAL  2.  Krisanna will self report >/= 50% decrease in pain from evaluation    Evaluation/Baseline (09/08/2022): 10/10 max pain Goal status: INITIAL     3.  [place] will report confidence in self management of condition at time of discharge with advanced HEP   Evaluation/Baseline (09/08/2022): unable to self manage Goal status: INITIAL     PLAN: PT FREQUENCY: 1-2x/week   PT DURATION: 8 weeks (Ending 11/03/2022)   PLANNED INTERVENTIONS: Therapeutic exercises,  Aquatic therapy, Therapeutic activity, Neuro Muscular re-education, Gait training, Patient/Family education, Joint mobilization, Dry Needling, Electrical stimulation, Spinal mobilization and/or manipulation, Moist heat, Taping, Vasopneumatic device, Ionotophoresis '4mg'$ /ml Dexamethasone, and Manual therapy   Kevan Ny Shanese Riemenschneider PT 09/22/2022, 12:08 PM

## 2022-09-22 NOTE — Therapy (Deleted)
OUTPATIENT PHYSICAL THERAPY TREATMENT NOTE   Patient Name: Sherri Lindsey MRN: [id number] DOB:04-Jun-1976, 47 y.o., female Today's Date: 09/22/2022  PCP: Inda Coke, PA  REFERRING PROVIDER: Inda Coke, PA   END OF SESSION:    Past Medical History:  Diagnosis Date   Edema 2014   intermittent since 2014, diuretic use prior   Gestational diabetes 2002   Migraine    Strabismus    Ulcer    Wears glasses    Past Surgical History:  Procedure Laterality Date   CESAREAN SECTION     2002, 2005   CHOLECYSTECTOMY     KNEE SURGERY     arthroscopic   TONSILLECTOMY     Patient Active Problem List   Diagnosis Date Noted   Deficiency anemia 08/27/2022   Dysfunctional uterine bleeding 08/27/2022   History of gestational diabetes 08/25/2022   Heartburn 08/25/2022   History of anemia 09/17/2016   Anxiety 09/17/2016    REFERRING DIAG: Chronic right-sided low back pain with right-sided sciatica [M54.41, G89.29], Right sided sciatica [M54.31]    THERAPY DIAG:  Other low back pain   Muscle weakness   Other abnormalities of gait and mobility  Rationale for Evaluation and Treatment Rehabilitation  PERTINENT HISTORY: anemia     PRECAUTIONS: none  SUBJECTIVE:                                                                                                                                                                                      SUBJECTIVE STATEMENT:  ***   PAIN:  Are you having pain? {OPRCPAIN:27236}   OBJECTIVE: (objective measures completed at initial evaluation unless otherwise dated)   DIAGNOSTIC FINDINGS:  X-ray:   IMPRESSION: Degenerative joint changes of lower lumbar spine, worst at L3-4.   GENERAL OBSERVATION/GAIT:           Significant antalgic gait with L W/S during ambulation   SENSATION:          Light touch: Deficits R L2 - L3   LUMBAR AROM   AROM AROM  09/08/2022  Flexion Fingertips to toes (WNL), w/ concordant pain; gowers sign   Extension limited by 50%, w/ concordant pain  Right lateral flexion WNL, w/ concordant pain  Left lateral flexion limited by >75%, w/ concordant pain  Right rotation limited by 25%, w/ concordant pain  Left rotation limited by 25%, w/ concordant pain    (Blank rows = not tested)    LE MMT:   MMT Right 09/08/2022 Left 09/08/2022  Hip flexion (L2, L3) 3* 4+  Knee extension (L3) 4* 4+  Knee flexion      Hip abduction  Hip extension      Hip external rotation      Hip internal rotation      Hip adduction      Ankle dorsiflexion (L4) c c  Ankle plantarflexion (S1) c c  Ankle inversion      Ankle eversion      Great Toe ext (L5) c c  Grossly        (Blank rows = not tested, score listed is out of 5 possible points.  N = WNL, D = diminished, C = clear for gross weakness with myotome testing, * = concordant pain with testing)    LUMBAR SPECIAL TESTS:  Straight leg raise: L (+ (crossed SLR)), R (+) Slump: L (-), R (+)   MUSCLE LENGTH: Hamstrings: Right  limited to 20 degrees d/t nerve pain  restriction; Left no restriction   Directional Preference: ext     LE ROM:   ROM Right 09/08/2022 Left 09/08/2022  Hip flexion      Hip extension      Hip abduction      Hip adduction      Hip internal rotation      Hip external rotation      Knee flexion      Knee extension      Ankle dorsiflexion      Ankle plantarflexion      Ankle inversion      Ankle eversion         (Blank rows = not tested, N = WNL, * = concordant pain with testing)   Functional Tests   Eval (09/08/2022)                                                                                                               PALPATION:            TTP lower lumbar spine, sxs reduction with PA   PATIENT SURVEYS:  FOTO 59 -> 73     TODAY'S TREATMENT  Creating, reviewing, and completing below HEP   PATIENT EDUCATION:  POC, diagnosis, prognosis, HEP, and outcome measures.  Pt educated via  explanation, demonstration, and handout (HEP).  Pt confirms understanding verbally.    HOME EXERCISE PROGRAM: Access Code: [email or web address] URL: [email or web address] Date: 09/08/2022 Prepared by: Shearon Balo   Exercises - Prone Press Up On Elbows  - 6 x daily - 7 x weekly - 2 sets - 10 reps   Treatment priorities     Eval (09/08/2022)              Ext based program              Manual PA mobilization              Neural mobs as able  ASSESSMENT:   CLINICAL IMPRESSION: Sherri Lindsey is a 47 y.o. female who presents to clinic with signs and sxs consistent with R sided low back pain with R sided nerve root irritation.  Clear nerve root irritation on exam with + SLR, X SLR, Slump.  Responded well to ext based exercises and PA mobilizations.   OBJECTIVE IMPAIRMENTS: Pain, lumbar ROM   ACTIVITY LIMITATIONS: lifting, bending, working   PERSONAL FACTORS: See medical history and pertinent history     REHAB POTENTIAL: Good   CLINICAL DECISION MAKING: Stable/uncomplicated   EVALUATION COMPLEXITY: Low     GOALS:     SHORT TERM GOALS: Target date: 10/06/2022   Sherri Lindsey will be >75% HEP compliant to improve carryover between sessions and facilitate independent management of condition   Evaluation (09/08/2022): ongoing Goal status: INITIAL     LONG TERM GOALS: Target date: 11/03/2022   Sherri Lindsey will improve FOTO score to 73 as a proxy for functional improvement   Evaluation/Baseline (09/08/2022): 59 Goal status: INITIAL     2.  Sherri Lindsey will self report >/= 50% decrease in pain from evaluation    Evaluation/Baseline (09/08/2022): 10/10 max pain Goal status: INITIAL     3.  Sherri Lindsey will report confidence in self management of condition at time of discharge with advanced HEP   Evaluation/Baseline (09/08/2022): unable to self manage Goal status: INITIAL     PLAN: PT FREQUENCY: 1-2x/week   PT DURATION: 8 weeks (Ending  11/03/2022)   PLANNED INTERVENTIONS: Therapeutic exercises, Aquatic therapy, Therapeutic activity, Neuro Muscular re-education, Gait training, Patient/Family education, Joint mobilization, Dry Needling, Electrical stimulation, Spinal mobilization and/or manipulation, Moist heat, Taping, Vasopneumatic device, Ionotophoresis '4mg'$ /ml Dexamethasone, and Manual therapy     Lanice Shirts, PT 09/22/2022, 7:51 AM

## 2022-09-23 ENCOUNTER — Ambulatory Visit: Payer: No Typology Code available for payment source

## 2022-09-24 ENCOUNTER — Ambulatory Visit: Payer: No Typology Code available for payment source | Admitting: Physician Assistant

## 2022-09-24 ENCOUNTER — Encounter: Payer: No Typology Code available for payment source | Admitting: Radiology

## 2022-09-29 ENCOUNTER — Ambulatory Visit: Payer: No Typology Code available for payment source | Attending: Sports Medicine | Admitting: Physical Therapy

## 2022-09-30 ENCOUNTER — Ambulatory Visit: Payer: No Typology Code available for payment source

## 2022-09-30 NOTE — Therapy (Deleted)
OUTPATIENT PHYSICAL THERAPY TREATMENT NOTE   Patient Name: Sherri Lindsey MRN: [id number] DOB:01-04-1976, 47 y.o., female Today's Date: 09/30/2022  PCP: Marland Kitchen REFERRING PROVIDER: ***  END OF SESSION:    Past Medical History:  Diagnosis Date   Edema 2014   intermittent since 2014, diuretic use prior   Gestational diabetes 2002   Migraine    Strabismus    Ulcer    Wears glasses    Past Surgical History:  Procedure Laterality Date   CESAREAN SECTION     2002, 2005   CHOLECYSTECTOMY     KNEE SURGERY     arthroscopic   TONSILLECTOMY     Patient Active Problem List   Diagnosis Date Noted   Deficiency anemia 08/27/2022   Dysfunctional uterine bleeding 08/27/2022   History of gestational diabetes 08/25/2022   Heartburn 08/25/2022   History of anemia 09/17/2016   Anxiety 09/17/2016    REFERRING DIAG: ***  THERAPY DIAG:  No diagnosis found.  Rationale for Evaluation and Treatment {HABREHAB:[id number]}  PERTINENT HISTORY: ***  PRECAUTIONS: ***  SUBJECTIVE:                                                                                                                                                                                      SUBJECTIVE STATEMENT:  ***   PAIN:  Are you having pain? 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   GENERAL OBSERVATION/GAIT:           Significant antalgic gait with L W/S during ambulation   SENSATION:          Light touch: Deficits R L2 - L3   LUMBAR AROM   AROM AROM  09/08/2022  Flexion Fingertips to toes (WNL), w/ concordant pain; gowers sign  Extension limited by 50%, w/ concordant pain  Right lateral flexion WNL, w/ concordant pain  Left lateral flexion limited by >75%, w/ concordant pain  Right rotation limited by 25%, w/ concordant pain  Left  rotation limited by 25%, w/ concordant pain    (Blank rows = not tested)    LE MMT:   MMT Right 09/08/2022 Left 09/08/2022  Hip flexion (L2, L3) 3* 4+  Knee extension (L3) 4* 4+  Knee flexion      Hip abduction      Hip extension      Hip external rotation      Hip internal rotation      Hip adduction      Ankle dorsiflexion (L4) c c  Ankle plantarflexion (  S1) c c  Ankle inversion      Ankle eversion      Great Toe ext (L5) c c  Grossly        (Blank rows = not tested, score listed is out of 5 possible points.  N = WNL, D = diminished, C = clear for gross weakness with myotome testing, * = concordant pain with testing)    LUMBAR SPECIAL TESTS:  Straight leg raise: L (+ (crossed SLR)), R (+) Slump: L (-), R (+)   MUSCLE LENGTH: Hamstrings: Right  limited to 20 degrees d/t nerve pain  restriction; Left no restriction   Directional Preference: ext     LE ROM:   ROM Right 09/08/2022 Left 09/08/2022  Hip flexion      Hip extension      Hip abduction      Hip adduction      Hip internal rotation      Hip external rotation      Knee flexion      Knee extension      Ankle dorsiflexion      Ankle plantarflexion      Ankle inversion      Ankle eversion         (Blank rows = not tested, N = WNL, * = concordant pain with testing)   Functional Tests   Eval (09/08/2022)                                                                                                               PALPATION:            TTP lower lumbar spine, sxs reduction with PA   PATIENT SURVEYS:  FOTO 59 -> 73     TODAY'S TREATMENT  Creating, reviewing, and completing below HEP   PATIENT EDUCATION:  POC, diagnosis, prognosis, HEP, and outcome measures.  Pt educated via explanation, demonstration, and handout (HEP).  Pt confirms understanding verbally.    HOME EXERCISE PROGRAM: Access Code: [email or web address] URL: [email or web address] Date: 09/08/2022 Prepared by: Shearon Balo   Exercises - Prone Press Up On Elbows  - 6 x daily - 7 x weekly - 2 sets - 10 reps   Treatment priorities     Eval (09/08/2022)              Ext based program              Manual PA mobilization              Neural mobs as able                                                ASSESSMENT:   CLINICAL IMPRESSION: Sherri Lindsey is a 47 y.o. female who presents to clinic with signs and sxs consistent with R sided low back pain with R sided nerve root irritation.  Clear nerve root  irritation on exam with + SLR, X SLR, Slump.  Responded well to ext based exercises and PA mobilizations.   OBJECTIVE IMPAIRMENTS: Pain, lumbar ROM   ACTIVITY LIMITATIONS: lifting, bending, working   PERSONAL FACTORS: See medical history and pertinent history     REHAB POTENTIAL: Good   CLINICAL DECISION MAKING: Stable/uncomplicated   EVALUATION COMPLEXITY: Low     GOALS:     SHORT TERM GOALS: Target date: 10/06/2022   Sherri Lindsey will be >75% HEP compliant to improve carryover between sessions and facilitate independent management of condition   Evaluation (09/08/2022): ongoing Goal status: INITIAL     LONG TERM GOALS: Target date: 11/03/2022   Sherri Lindsey will improve FOTO score to 73 as a proxy for functional improvement   Evaluation/Baseline (09/08/2022): 59 Goal status: INITIAL     2.  Sherri Lindsey will self report >/= 50% decrease in pain from evaluation    Evaluation/Baseline (09/08/2022): 10/10 max pain Goal status: INITIAL     3.  Sherri Lindsey will report confidence in self management of condition at time of discharge with advanced HEP   Evaluation/Baseline (09/08/2022): unable to self manage Goal status: INITIAL     PLAN: PT FREQUENCY: 1-2x/week   PT DURATION: 8 weeks (Ending 11/03/2022)   PLANNED INTERVENTIONS: Therapeutic exercises, Aquatic therapy, Therapeutic activity, Neuro Muscular re-education, Gait training, Patient/Family education, Joint mobilization, Dry Needling, Electrical  stimulation, Spinal mobilization and/or manipulation, Moist heat, Taping, Vasopneumatic device, Ionotophoresis 4mg /ml Dexamethasone, and Manual therapy   Ward Chatters, PT 09/30/2022, 1:39 PM

## 2022-10-01 ENCOUNTER — Other Ambulatory Visit: Payer: Self-pay | Admitting: *Deleted

## 2022-10-01 ENCOUNTER — Encounter: Payer: Self-pay | Admitting: Oncology

## 2022-10-01 ENCOUNTER — Inpatient Hospital Stay (HOSPITAL_BASED_OUTPATIENT_CLINIC_OR_DEPARTMENT_OTHER): Payer: No Typology Code available for payment source | Admitting: Oncology

## 2022-10-01 ENCOUNTER — Other Ambulatory Visit: Payer: Self-pay

## 2022-10-01 ENCOUNTER — Inpatient Hospital Stay: Payer: No Typology Code available for payment source | Attending: Oncology

## 2022-10-01 VITALS — BP 137/81 | HR 82 | Temp 98.4°F | Resp 16 | Ht 63.5 in | Wt 276.6 lb

## 2022-10-01 DIAGNOSIS — D539 Nutritional anemia, unspecified: Secondary | ICD-10-CM | POA: Diagnosis not present

## 2022-10-01 DIAGNOSIS — Z79899 Other long term (current) drug therapy: Secondary | ICD-10-CM | POA: Diagnosis not present

## 2022-10-01 DIAGNOSIS — M545 Low back pain, unspecified: Secondary | ICD-10-CM | POA: Diagnosis not present

## 2022-10-01 DIAGNOSIS — Z9049 Acquired absence of other specified parts of digestive tract: Secondary | ICD-10-CM | POA: Diagnosis not present

## 2022-10-01 DIAGNOSIS — F5089 Other specified eating disorder: Secondary | ICD-10-CM | POA: Insufficient documentation

## 2022-10-01 DIAGNOSIS — K5900 Constipation, unspecified: Secondary | ICD-10-CM | POA: Diagnosis not present

## 2022-10-01 DIAGNOSIS — D500 Iron deficiency anemia secondary to blood loss (chronic): Secondary | ICD-10-CM | POA: Diagnosis not present

## 2022-10-01 DIAGNOSIS — N938 Other specified abnormal uterine and vaginal bleeding: Secondary | ICD-10-CM | POA: Insufficient documentation

## 2022-10-01 LAB — IRON AND IRON BINDING CAPACITY (CC-WL,HP ONLY)
Iron: 10 ug/dL — ABNORMAL LOW (ref 28–170)
Saturation Ratios: 2 % — ABNORMAL LOW (ref 10.4–31.8)
TIBC: 463 ug/dL — ABNORMAL HIGH (ref 250–450)
UIBC: 453 ug/dL — ABNORMAL HIGH (ref 148–442)

## 2022-10-01 LAB — CBC WITH DIFFERENTIAL (CANCER CENTER ONLY)
Abs Immature Granulocytes: 0.01 10*3/uL (ref 0.00–0.07)
Basophils Absolute: 0.1 10*3/uL (ref 0.0–0.1)
Basophils Relative: 1 %
Eosinophils Absolute: 0.1 10*3/uL (ref 0.0–0.5)
Eosinophils Relative: 1 %
HCT: 29.5 % — ABNORMAL LOW (ref 36.0–46.0)
Hemoglobin: 8 g/dL — ABNORMAL LOW (ref 12.0–15.0)
Immature Granulocytes: 0 %
Lymphocytes Relative: 29 %
Lymphs Abs: 1.4 10*3/uL (ref 0.7–4.0)
MCH: 17 pg — ABNORMAL LOW (ref 26.0–34.0)
MCHC: 27.1 g/dL — ABNORMAL LOW (ref 30.0–36.0)
MCV: 62.6 fL — ABNORMAL LOW (ref 80.0–100.0)
Monocytes Absolute: 0.4 10*3/uL (ref 0.1–1.0)
Monocytes Relative: 8 %
Neutro Abs: 2.9 10*3/uL (ref 1.7–7.7)
Neutrophils Relative %: 61 %
Platelet Count: 342 10*3/uL (ref 150–400)
RBC: 4.71 MIL/uL (ref 3.87–5.11)
RDW: 23 % — ABNORMAL HIGH (ref 11.5–15.5)
WBC Count: 4.7 10*3/uL (ref 4.0–10.5)
nRBC: 0 % (ref 0.0–0.2)

## 2022-10-01 LAB — CMP (CANCER CENTER ONLY)
ALT: 7 U/L (ref 0–44)
AST: 12 U/L — ABNORMAL LOW (ref 15–41)
Albumin: 4.3 g/dL (ref 3.5–5.0)
Alkaline Phosphatase: 69 U/L (ref 38–126)
Anion gap: 9 (ref 5–15)
BUN: 7 mg/dL (ref 6–20)
CO2: 24 mmol/L (ref 22–32)
Calcium: 9.1 mg/dL (ref 8.9–10.3)
Chloride: 105 mmol/L (ref 98–111)
Creatinine: 0.95 mg/dL (ref 0.44–1.00)
GFR, Estimated: >60 mL/min (ref >60)
Glucose, Bld: 102 mg/dL — ABNORMAL HIGH (ref 70–99)
Potassium: 3.8 mmol/L (ref 3.5–5.1)
Sodium: 138 mmol/L (ref 135–145)
Total Bilirubin: 0.6 mg/dL (ref 0.3–1.2)
Total Protein: 7.9 g/dL (ref 6.5–8.1)

## 2022-10-01 LAB — FERRITIN: Ferritin: 3 ng/mL — ABNORMAL LOW (ref 11–307)

## 2022-10-01 NOTE — Progress Notes (Unsigned)
Patient referral to GI not scheduled from 12/16.  Called [facility] and they are going to call patient today to schedule appt.

## 2022-10-01 NOTE — Progress Notes (Unsigned)
[facility] Initial Visit:  Patient Care Team: Inda Coke, [place] as PCP - General (Physician Assistant)  CHIEF COMPLAINTS/PURPOSE OF CONSULTATION:  HISTORY OF PRESENTING ILLNESS: Sherri Lindsey 47 y.o. female is here because of anemia Medical history notable for gestational diabetes, migraine, strabismus, low back pain, morbid obesity  September 15, 2020: Pelvic ultrasound notable for fibroid uterus  August 25, 2022: WBC 6.3 hemoglobin 7.4 MCV 57 platelet count 37; 54 segs 37 lymphs 8 monos 1 EO 1 basophil Ferritin 3.1 H. pylori IgG negative  August 27, 2022: [facility]  Patient is G2 P2 Menopause not reached.  Menses occur monthly and last 6 to 7 days and are regular.  Bleeding is heavy.  Does not have bleeding between periods.    Does no pass clots.  Last pregnancy was delivered by C section in 2008.  Patient has uterine fibroids.  Does not take oral iron regularly.  Has received IV iron in the past without complication.  Has not required PRBC's in the past.  Has constipation with oral iron.  Has a normal diet.  No history of postpartum hemorrhage or hemorrhage after surgery.  Occasional hematochezia.  No melena, hemoptysis, hematuria.  No history of intra-articular or soft tissue bleeding.  No history of abnormal bleeding in family members.  Patient has symptoms of fatigue, pallor,  DOE, decreased performance status.  Patient has pica to ice but not to starch/dirt.    To see Gynecology on September 24 2022.  Will arrange for IV iron and GI appointment  WBC 6.3 hemoglobin 7.4 MCV 57 platelet count 337; 54 segs 37 lymphs 8 monos 1 EO 1 basophil Hemoglobin electrophoresis normal adult pattern Factor VIII 266 von Willebrand factor antigen 180 ristocetin cofactor 96 fibrinogen 475 INR 1.0 PTT 31 Ferritin 3.1 folate 344 B12 218 CMP notable for glucose of 142  September 03, 2022:  Venofer 200 mg  October 01 2022:  Scheduled follow up for management of  anemia.  Reviewed results of labs with patient.  Feels better but still a bit winded.  Has not seen Gynecology because the visit was scheduled during her menses.  Waiting to hear when her new appointment will be.  Ice pica better but still there.   Arrange for the additional 4 doses of IV iron  Ferritin 2.  Hgb 8.0 MCV 63  Review of Systems - Oncology  MEDICAL HISTORY: Past Medical History:  Diagnosis Date   Edema 2014   intermittent since 2014, diuretic use prior   Gestational diabetes 2002   Migraine    Strabismus    Ulcer    Wears glasses     SURGICAL HISTORY: Past Surgical History:  Procedure Laterality Date   CESAREAN SECTION     2002, 2005   CHOLECYSTECTOMY     KNEE SURGERY     arthroscopic   TONSILLECTOMY      SOCIAL HISTORY: Social History   Socioeconomic History   Marital status: Single    Spouse name: Not on file   Number of children: Not on file   Years of education: Not on file   Highest education level: Not on file  Occupational History   Not on file  Tobacco Use   Smoking status: Never   Smokeless tobacco: Never  Vaping Use   Vaping Use: Never used  Substance and Sexual Activity   Alcohol use: Yes    Comment: rarely   Drug use: No   Sexual activity: Not Currently  Other Topics Concern   Not on file  Social History Narrative   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, mom is currently independent   Social Determinants of Health   Financial Resource Strain: Low Risk  (08/27/2022)   Overall Financial Resource Strain (CARDIA)    Difficulty of Paying Living Expenses: Not hard at all  Food Insecurity: No Food Insecurity (08/27/2022)   Hunger Vital Sign    Worried About Running Out of Food in the Last Year: Never true    [place] in the Last Year: Never true  Transportation Needs: No Transportation Needs (08/27/2022)   PRAPARE - Hydrologist (Medical): No    Lack of Transportation  (Non-Medical): No  Physical Activity: Not on file  Stress: Not on file  Social Connections: Not on file  Intimate Partner Violence: Not At Risk (08/27/2022)   Humiliation, Afraid, Rape, and Kick questionnaire    Fear of Current or Ex-Partner: No    Emotionally Abused: No    Physically Abused: No    Sexually Abused: No    FAMILY HISTORY Family History  Problem Relation Age of Onset   Hypertension Mother    Diabetes Mother    Hypertension Father    Kidney failure Father    Hyperlipidemia Father    Hypertension Sister    Kidney failure Sister    Hypertension Brother    Hypertension Maternal Grandmother    Heart disease Neg Hx     ALLERGIES:  has No Known Allergies.  MEDICATIONS:  Current Outpatient Medications  Medication Sig Dispense Refill   cyclobenzaprine (FLEXERIL) 5 MG tablet Take 1 tablet (5 mg total) by mouth 3 (three) times daily as needed for muscle spasms. 30 tablet 0   diclofenac (VOLTAREN) 75 MG EC tablet Take 1 tablet (75 mg total) by mouth 2 (two) times daily. 30 tablet 0   ibuprofen (ADVIL) 200 MG tablet Take 400-600 mg by mouth every 6 (six) hours as needed for headache or moderate pain.     meloxicam (MOBIC) 15 MG tablet Take 1 tablet (15 mg total) by mouth daily. 30 tablet 0   No current facility-administered medications for this visit.    PHYSICAL EXAMINATION:  ECOG PERFORMANCE STATUS: 1 - Symptomatic but completely ambulatory   Vitals:   10/01/22 0959  BP: 137/81  Pulse: 82  Resp: 16  Temp: 98.4 F (36.9 C)  SpO2: 100%    Filed Weights   10/01/22 0959  Weight: 276 lb 9.6 oz (125.5 kg)     Physical Exam Vitals and nursing note reviewed.  Constitutional:      General: She is not in acute distress.    Appearance: Normal appearance. She is obese. She is not ill-appearing, toxic-appearing or diaphoretic.  HENT:     Head: Normocephalic and atraumatic.     Right Ear: External ear normal.     Left Ear: External ear normal.     Nose: Nose  normal. No congestion or rhinorrhea.  Eyes:     General: No scleral icterus.    Extraocular Movements: Extraocular movements intact.     Conjunctiva/sclera: Conjunctivae normal.     Pupils: Pupils are equal, round, and reactive to light.  Cardiovascular:     Rate and Rhythm: Normal rate.     Heart sounds: No murmur heard.    No friction rub. No gallop.  Pulmonary:     Effort: Pulmonary effort is  normal. No respiratory distress.     Breath sounds: Normal breath sounds. No wheezing.  Abdominal:     General: Bowel sounds are normal.     Palpations: Abdomen is soft.     Tenderness: There is no abdominal tenderness. There is no guarding or rebound.  Musculoskeletal:        General: No swelling, tenderness or deformity.     Cervical back: Normal range of motion and neck supple. No rigidity or tenderness.  Lymphadenopathy:     Head:     Right side of head: No submental, submandibular, tonsillar, preauricular, posterior auricular or occipital adenopathy.     Left side of head: No submental, submandibular, tonsillar, preauricular, posterior auricular or occipital adenopathy.     Cervical: No cervical adenopathy.     Right cervical: No superficial, deep or posterior cervical adenopathy.    Left cervical: No superficial, deep or posterior cervical adenopathy.     Upper Body:     Right upper body: No supraclavicular, axillary, pectoral or epitrochlear adenopathy.     Left upper body: No supraclavicular, axillary, pectoral or epitrochlear adenopathy.  Skin:    General: Skin is warm.     Coloration: Skin is not jaundiced.     Findings: No erythema or rash.  Neurological:     General: No focal deficit present.     Mental Status: She is alert and oriented to person, place, and time.     Cranial Nerves: No cranial nerve deficit.     Motor: No weakness.  Psychiatric:        Mood and Affect: Mood normal.        Behavior: Behavior normal.        Thought Content: Thought content normal.         Judgment: Judgment normal.      LABORATORY DATA: I have personally reviewed the data as listed:  Appointment on 10/01/2022  Component Date Value Ref Range Status   Sodium 10/01/2022 138  135 - 145 mmol/L Final   Potassium 10/01/2022 3.8  3.5 - 5.1 mmol/L Final   Chloride 10/01/2022 105  98 - 111 mmol/L Final   CO2 10/01/2022 24  22 - 32 mmol/L Final   Glucose, Bld 10/01/2022 102 (H)  70 - 99 mg/dL Final   Glucose reference range applies only to samples taken after fasting for at least 8 hours.   BUN 10/01/2022 7  6 - 20 mg/dL Final   Creatinine 10/01/2022 0.95  0.44 - 1.00 mg/dL Final   Calcium 10/01/2022 9.1  8.9 - 10.3 mg/dL Final   Total Protein 10/01/2022 7.9  6.5 - 8.1 g/dL Final   Albumin 10/01/2022 4.3  3.5 - 5.0 g/dL Final   AST 10/01/2022 12 (L)  15 - 41 U/L Final   ALT 10/01/2022 7  0 - 44 U/L Final   Alkaline Phosphatase 10/01/2022 69  38 - 126 U/L Final   Total Bilirubin 10/01/2022 0.6  0.3 - 1.2 mg/dL Final   GFR, Estimated 10/01/2022 >60  >60 mL/min Final   Comment: (NOTE) Calculated using the CKD-EPI Creatinine Equation (2021)    Anion gap 10/01/2022 9  5 - 15 Final   Performed at [facility], [place] [address] [place], [place] [address]    RADIOGRAPHIC STUDIES: I have personally reviewed the radiological images as listed and agree with the findings in the report  No results found.  ASSESSMENT/PLAN  47 y.o. female is here because of anemia  Medical history notable for gestational diabetes, migraine, strabismus, low back pain, morbid obesity  Anemia:  Due to iron deficiency anemia owing to dysfunctional uterine bleeding/ exacerbated by high demand from prior pregnancies.   September 03, 2022:  Venofer 200 mg   October 01 2022- Ferritin 2.  Hgb 8.0 MCV 63.  Will arrange for 4 additional doses of Venofer  Dysfunctional uterine bleeding:  This is characterized by menses that are prolonged, irregular as well as by heavy bleeding with  passage of clots.    August 27 2022- No evidence of bleeding disorder.  PLT count normal,.  PT, PTT, Fibrinogen, von Willebrand screen normal October 01 2022- To see gynecology for evaluation and management    Cancer Staging  No matching staging information was found for the patient.   No problem-specific Assessment & Plan notes found for this encounter.   No orders of the defined types were placed in this encounter.  30  minutes was spent in patient care.  This included time spent preparing to see the patient (e.g., review of tests), obtaining and/or reviewing separately obtained history, counseling and educating the patient, ordering medications, tests, or procedures; documenting clinical information in the electronic or other health record, independently interpreting results and communicating results to the patient as well as coordination of care.      All questions were answered. The patient knows to call the clinic with any problems, questions or concerns.  This note was electronically signed.    Barbee Cough, MD  10/01/2022 10:27 AM

## 2022-10-05 ENCOUNTER — Ambulatory Visit: Payer: No Typology Code available for payment source | Admitting: Physical Therapy

## 2022-10-05 NOTE — Therapy (Deleted)
OUTPATIENT PHYSICAL THERAPY TREATMENT NOTE   Patient Name: Sherri Lindsey MRN: [id number] DOB:11/15/1975, 47 y.o., female Today's Date: 10/05/2022  PCP: Sherri Coke, PA   REFERRING PROVIDER: Inda Coke, PA    Past Medical History:  Diagnosis Date   Edema 2014   intermittent since 2014, diuretic use prior   Gestational diabetes 2002   Migraine    Strabismus    Ulcer    Wears glasses    Past Surgical History:  Procedure Laterality Date   CESAREAN SECTION     2002, 2005   CHOLECYSTECTOMY     KNEE SURGERY     arthroscopic   TONSILLECTOMY     Patient Active Problem List   Diagnosis Date Noted   Deficiency anemia 08/27/2022   Dysfunctional uterine bleeding 08/27/2022   History of gestational diabetes 08/25/2022   Heartburn 08/25/2022   History of anemia 09/17/2016   Anxiety 09/17/2016    THERAPY DIAG:  No diagnosis found.   Rationale for Evaluation and Treatment Rehabilitation  REFERRING DIAG: Chronic right-sided low back pain with right-sided sciatica [M54.41, G89.29], Right sided sciatica [M54.31]   PERTINENT HISTORY: anemia    PRECAUTIONS/RESTRICTIONS:   None   SUBJECTIVE:  ***  Pain:  Are you having pain? 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   GENERAL OBSERVATION/GAIT:           Significant antalgic gait with L W/S during ambulation   SENSATION:          Light touch: Deficits R L2 - L3   LUMBAR AROM   AROM AROM  09/08/2022  Flexion  Fingertips to toes (WNL), w/ concordant pain; gowers sign  Extension limited by 50%, w/ concordant pain  Right lateral flexion WNL, w/ concordant pain  Left lateral flexion limited by >75%, w/ concordant pain  Right rotation limited by 25%, w/ concordant pain  Left rotation limited by 25%, w/ concordant pain    (Blank rows = not tested)    LE MMT:   MMT Right 09/08/2022 Left 09/08/2022  Hip flexion (L2, L3) 3* 4+  Knee extension (L3) 4* 4+  Knee flexion      Hip abduction      Hip extension      Hip external rotation      Hip internal rotation      Hip adduction      Ankle dorsiflexion (L4) c c  Ankle plantarflexion (S1) c c  Ankle inversion      Ankle eversion      Great Toe ext (L5) c c  Grossly        (Blank rows = not tested, score listed is out of 5 possible points.  N = WNL, D = diminished, C = clear for gross weakness with myotome testing, * = concordant pain with testing)    LUMBAR SPECIAL TESTS:  Straight leg raise: L (+ (crossed SLR)), R (+) Slump: L (-), R (+)   MUSCLE LENGTH: Hamstrings: Right  limited to 20 degrees d/t nerve pain  restriction; Left no restriction   Directional  Preference: ext     LE ROM:   ROM Right 09/08/2022 Left 09/08/2022  Hip flexion      Hip extension      Hip abduction      Hip adduction      Hip internal rotation      Hip external rotation      Knee flexion      Knee extension      Ankle dorsiflexion      Ankle plantarflexion      Ankle inversion      Ankle eversion         (Blank rows = not tested, N = WNL, * = concordant pain with testing)   Functional Tests   Eval (09/08/2022)                                                                                                               PALPATION:            TTP lower lumbar spine, sxs reduction with PA   PATIENT SURVEYS:  FOTO 59 -> 73     TODAY'S TREATMENT  Creating, reviewing, and completing below HEP   PATIENT EDUCATION:  POC, diagnosis,  prognosis, HEP, and outcome measures.  Pt educated via explanation, demonstration, and handout (HEP).  Pt confirms understanding verbally.    HOME EXERCISE PROGRAM: Access Code: [email or web address] URL: [email or web address] Date: 09/08/2022 Prepared by: Shearon Balo   Exercises - Prone Press Up On Elbows  - 6 x daily - 7 x weekly - 2 sets - 10 reps   Treatment priorities     Eval (09/08/2022)              Ext based program              Manual PA mobilization              Neural mobs as able                                                 TREATMENT ***:  Therapeutic Exercise: - ***  Manual Therapy: - ***  Neuromuscular re-ed: - ***  Therapeutic Activity: - ***  Self-care/Home Management: - ***  ASSESSMENT:   CLINICAL IMPRESSION: Sherri Lindsey is a 47 y.o. female who presents to clinic with signs and sxs consistent with R sided low back pain with R sided nerve root irritation.  Clear nerve root irritation on exam with + SLR, X SLR, Slump.  Responded well to ext based exercises and PA mobilizations.   OBJECTIVE IMPAIRMENTS: Pain, lumbar ROM   ACTIVITY LIMITATIONS: lifting, bending, working   PERSONAL FACTORS: See medical history and pertinent history     REHAB POTENTIAL: Good   CLINICAL DECISION MAKING: Stable/uncomplicated   EVALUATION COMPLEXITY: Low     GOALS:     SHORT TERM GOALS: Target date: 10/06/2022   Sherri Lindsey will be >75%  HEP compliant to improve carryover between sessions and facilitate independent management of condition   Evaluation (09/08/2022): ongoing Goal status: INITIAL     LONG TERM GOALS: Target date: 11/03/2022   Sherri Lindsey will improve FOTO score to 73 as a proxy for functional improvement   Evaluation/Baseline (09/08/2022): 59 Goal status: INITIAL     2.  Sherri Lindsey will self report >/= 50% decrease in pain from evaluation    Evaluation/Baseline (09/08/2022): 10/10 max pain Goal status: INITIAL     3.  Sherri Lindsey will report  confidence in self management of condition at time of discharge with advanced HEP   Evaluation/Baseline (09/08/2022): unable to self manage Goal status: INITIAL     PLAN: PT FREQUENCY: 1-2x/week   PT DURATION: 8 weeks (Ending 11/03/2022)   PLANNED INTERVENTIONS: Therapeutic exercises, Aquatic therapy, Therapeutic activity, Neuro Muscular re-education, Gait training, Patient/Family education, Joint mobilization, Dry Needling, Electrical stimulation, Spinal mobilization and/or manipulation, Moist heat, Taping, Vasopneumatic device, Ionotophoresis 4mg /ml Dexamethasone, and Manual therapy   Kevan Ny Rupinder Livingston PT 10/05/2022, 9:40 AM

## 2022-10-06 ENCOUNTER — Encounter: Payer: Self-pay | Admitting: Oncology

## 2022-10-07 ENCOUNTER — Ambulatory Visit: Payer: No Typology Code available for payment source

## 2022-10-09 ENCOUNTER — Inpatient Hospital Stay: Payer: No Typology Code available for payment source

## 2022-10-09 VITALS — BP 125/89 | HR 65 | Temp 97.6°F | Resp 22

## 2022-10-09 DIAGNOSIS — D539 Nutritional anemia, unspecified: Secondary | ICD-10-CM

## 2022-10-09 DIAGNOSIS — D500 Iron deficiency anemia secondary to blood loss (chronic): Secondary | ICD-10-CM | POA: Diagnosis not present

## 2022-10-09 MED ORDER — ACETAMINOPHEN 325 MG PO TABS
650.0000 mg | ORAL_TABLET | Freq: Once | ORAL | Status: AC
  Administered 2022-10-09: 650 mg via ORAL
  Filled 2022-10-09: qty 2

## 2022-10-09 MED ORDER — LORATADINE 10 MG PO TABS
10.0000 mg | ORAL_TABLET | Freq: Once | ORAL | Status: AC
  Administered 2022-10-09: 10 mg via ORAL
  Filled 2022-10-09: qty 1

## 2022-10-09 MED ORDER — SODIUM CHLORIDE 0.9 % IV SOLN
200.0000 mg | Freq: Once | INTRAVENOUS | Status: AC
  Administered 2022-10-09: 200 mg via INTRAVENOUS
  Filled 2022-10-09: qty 200

## 2022-10-09 MED ORDER — SODIUM CHLORIDE 0.9 % IV SOLN: Freq: Once | INTRAVENOUS | Status: AC

## 2022-10-09 NOTE — Patient Instructions (Signed)

## 2022-10-09 NOTE — Progress Notes (Signed)
Patient observed for 30 min post transfusion, no complaints of SOB, lightheadedness, nor dizziness.  VSS at discharge.  Ambulated to lobby.

## 2022-10-12 ENCOUNTER — Encounter: Payer: Self-pay | Admitting: Oncology

## 2022-10-12 NOTE — Addendum Note (Signed)
Addended by: Juanetta Beets on: 10/12/2022 02:44 PM   Modules accepted: Orders

## 2022-10-15 MED FILL — Iron Sucrose Inj 20 MG/ML (Fe Equiv): INTRAVENOUS | Qty: 10 | Status: AC

## 2022-10-16 ENCOUNTER — Inpatient Hospital Stay: Payer: No Typology Code available for payment source | Attending: Oncology

## 2022-10-16 VITALS — BP 124/80 | HR 66 | Temp 98.4°F | Resp 16

## 2022-10-16 DIAGNOSIS — Z79899 Other long term (current) drug therapy: Secondary | ICD-10-CM | POA: Diagnosis not present

## 2022-10-16 DIAGNOSIS — N939 Abnormal uterine and vaginal bleeding, unspecified: Secondary | ICD-10-CM | POA: Insufficient documentation

## 2022-10-16 DIAGNOSIS — D539 Nutritional anemia, unspecified: Secondary | ICD-10-CM

## 2022-10-16 DIAGNOSIS — D500 Iron deficiency anemia secondary to blood loss (chronic): Secondary | ICD-10-CM | POA: Diagnosis present

## 2022-10-16 MED ORDER — SODIUM CHLORIDE 0.9 % IV SOLN
200.0000 mg | Freq: Once | INTRAVENOUS | Status: AC
  Administered 2022-10-16: 200 mg via INTRAVENOUS
  Filled 2022-10-16: qty 200

## 2022-10-16 MED ORDER — SODIUM CHLORIDE 0.9 % IV SOLN: Freq: Once | INTRAVENOUS | Status: AC

## 2022-10-16 MED ORDER — ACETAMINOPHEN 325 MG PO TABS
650.0000 mg | ORAL_TABLET | Freq: Once | ORAL | Status: AC
  Administered 2022-10-16: 650 mg via ORAL
  Filled 2022-10-16: qty 2

## 2022-10-16 MED ORDER — LORATADINE 10 MG PO TABS
10.0000 mg | ORAL_TABLET | Freq: Once | ORAL | Status: AC
  Administered 2022-10-16: 10 mg via ORAL
  Filled 2022-10-16: qty 1

## 2022-10-16 NOTE — Patient Instructions (Signed)

## 2022-10-22 MED FILL — Iron Sucrose Inj 20 MG/ML (Fe Equiv): INTRAVENOUS | Qty: 10 | Status: AC

## 2022-10-23 ENCOUNTER — Inpatient Hospital Stay: Payer: No Typology Code available for payment source

## 2022-10-27 ENCOUNTER — Telehealth: Payer: Self-pay | Admitting: Oncology

## 2022-10-27 NOTE — Telephone Encounter (Signed)
Patient called back to reschedule iron infusions that were previously missed. Patient rescheduled and notified.

## 2022-10-28 ENCOUNTER — Other Ambulatory Visit: Payer: Self-pay | Admitting: Sports Medicine

## 2022-10-30 ENCOUNTER — Inpatient Hospital Stay: Payer: No Typology Code available for payment source

## 2022-11-02 ENCOUNTER — Inpatient Hospital Stay: Payer: No Typology Code available for payment source

## 2022-11-05 ENCOUNTER — Inpatient Hospital Stay: Payer: No Typology Code available for payment source

## 2022-11-05 ENCOUNTER — Inpatient Hospital Stay: Payer: No Typology Code available for payment source | Admitting: Oncology

## 2022-11-05 VITALS — BP 131/83 | HR 65 | Temp 98.2°F | Resp 18

## 2022-11-05 DIAGNOSIS — D500 Iron deficiency anemia secondary to blood loss (chronic): Secondary | ICD-10-CM | POA: Diagnosis not present

## 2022-11-05 DIAGNOSIS — D539 Nutritional anemia, unspecified: Secondary | ICD-10-CM

## 2022-11-05 MED ORDER — LORATADINE 10 MG PO TABS
10.0000 mg | ORAL_TABLET | Freq: Once | ORAL | Status: AC
  Administered 2022-11-05: 10 mg via ORAL
  Filled 2022-11-05: qty 1

## 2022-11-05 MED ORDER — SODIUM CHLORIDE 0.9 % IV SOLN
200.0000 mg | Freq: Once | INTRAVENOUS | Status: AC
  Administered 2022-11-05: 200 mg via INTRAVENOUS
  Filled 2022-11-05: qty 200

## 2022-11-05 MED ORDER — ACETAMINOPHEN 325 MG PO TABS
650.0000 mg | ORAL_TABLET | Freq: Once | ORAL | Status: AC
  Administered 2022-11-05: 650 mg via ORAL
  Filled 2022-11-05: qty 2

## 2022-11-05 MED ORDER — SODIUM CHLORIDE 0.9 % IV SOLN: Freq: Once | INTRAVENOUS | Status: AC

## 2022-11-05 MED ORDER — SODIUM CHLORIDE 0.9 % IV SOLN
200.0000 mg | Freq: Once | INTRAVENOUS | Status: DC
  Filled 2022-11-05: qty 10

## 2022-11-05 NOTE — Progress Notes (Signed)
Pt declined staying for 30 minute post-observation following infusion. Pt stable at d/c to lobby and no complaints. See flowsheets for VS.

## 2022-11-09 ENCOUNTER — Inpatient Hospital Stay: Payer: No Typology Code available for payment source

## 2022-11-12 ENCOUNTER — Encounter: Payer: Self-pay | Admitting: Oncology

## 2022-11-12 ENCOUNTER — Inpatient Hospital Stay: Payer: No Typology Code available for payment source | Attending: Oncology

## 2022-11-12 VITALS — BP 124/81 | HR 76 | Temp 98.9°F | Resp 18 | Ht 63.5 in | Wt 279.5 lb

## 2022-11-12 DIAGNOSIS — N938 Other specified abnormal uterine and vaginal bleeding: Secondary | ICD-10-CM | POA: Diagnosis not present

## 2022-11-12 DIAGNOSIS — D500 Iron deficiency anemia secondary to blood loss (chronic): Secondary | ICD-10-CM | POA: Insufficient documentation

## 2022-11-12 DIAGNOSIS — Z79899 Other long term (current) drug therapy: Secondary | ICD-10-CM | POA: Insufficient documentation

## 2022-11-12 DIAGNOSIS — D539 Nutritional anemia, unspecified: Secondary | ICD-10-CM

## 2022-11-12 MED ORDER — LORATADINE 10 MG PO TABS
10.0000 mg | ORAL_TABLET | Freq: Once | ORAL | Status: AC
  Administered 2022-11-12: 10 mg via ORAL
  Filled 2022-11-12: qty 1

## 2022-11-12 MED ORDER — SODIUM CHLORIDE 0.9 % IV SOLN: Freq: Once | INTRAVENOUS | Status: AC

## 2022-11-12 MED ORDER — ACETAMINOPHEN 325 MG PO TABS
650.0000 mg | ORAL_TABLET | Freq: Once | ORAL | Status: AC
  Administered 2022-11-12: 650 mg via ORAL
  Filled 2022-11-12: qty 2

## 2022-11-12 MED ORDER — SODIUM CHLORIDE 0.9 % IV SOLN
200.0000 mg | Freq: Once | INTRAVENOUS | Status: AC
  Administered 2022-11-12: 200 mg via INTRAVENOUS
  Filled 2022-11-12: qty 200

## 2022-11-12 NOTE — Patient Instructions (Signed)

## 2022-11-12 NOTE — Addendum Note (Signed)
Addended by: Leatha Gilding on: 11/12/2022 11:18 AM   Modules accepted: Orders

## 2022-11-12 NOTE — Progress Notes (Signed)
Pt. declines to stay for 30 minute post observation. Vital signs stable, left via ambulation, and no shortness of breath noted.

## 2022-11-15 ENCOUNTER — Ambulatory Visit (INDEPENDENT_AMBULATORY_CARE_PROVIDER_SITE_OTHER): Payer: No Typology Code available for payment source | Admitting: Gastroenterology

## 2022-11-15 ENCOUNTER — Encounter: Payer: Self-pay | Admitting: Gastroenterology

## 2022-11-15 VITALS — BP 118/76 | HR 81 | Ht 63.5 in | Wt 277.0 lb

## 2022-11-15 DIAGNOSIS — K219 Gastro-esophageal reflux disease without esophagitis: Secondary | ICD-10-CM | POA: Diagnosis not present

## 2022-11-15 DIAGNOSIS — D509 Iron deficiency anemia, unspecified: Secondary | ICD-10-CM

## 2022-11-15 DIAGNOSIS — R110 Nausea: Secondary | ICD-10-CM | POA: Diagnosis not present

## 2022-11-15 MED ORDER — PANTOPRAZOLE SODIUM 40 MG PO TBEC: 40.0000 mg | DELAYED_RELEASE_TABLET | Freq: Every day | ORAL | 5 refills | Status: DC

## 2022-11-15 NOTE — Progress Notes (Signed)
Agree with assessment/plan.  Raj Naythen Heikkila, MD [facility] [phone]  

## 2022-11-15 NOTE — Progress Notes (Signed)
11/15/2022 Sherri Lindsey [id number] [birth year]   HISTORY OF PRESENT ILLNESS: This is a 47 year old female who has been referred here by Dr. Angelene Giovanni with hematology/oncology for evaluation of iron deficiency anemia.  She tells me that she has had issues with anemia on and off over the years.  A couple of months ago her hemoglobin is 7.4 g that she received a unit of packed red blood cells.  Also recently received IV iron infusions.  Iron levels were very low.  She tells that she does have heavy menses and is following with GYN as well.  She denies any black or bloody stools.  Never had EGD or colonoscopy in the past.  She says that as of late she has been having issues with a lot of indigestion or reflux.  She said really more so over the past couple of months.  Has been taking Pepcid twice a day, but no matter what she eats or drinks she has a burning sensation.  Even drinking a bottle of water will cause this.  She has been getting a lot of random nausea as well.  She tells me that she thinks she had an endoscopy when she was 50 or 47 years old and was told that she had an ulcer, but no issues since then.  She was using NSAIDs for her back, but none of them helped so she has not continued them.   Past Medical History:  Diagnosis Date   Edema 2014   intermittent since 2014, diuretic use prior   Gestational diabetes 2002   Migraine    Strabismus    Ulcer    Wears glasses    Past Surgical History:  Procedure Laterality Date   CESAREAN SECTION     2002, 2005   CHOLECYSTECTOMY     KNEE SURGERY     arthroscopic   TONSILLECTOMY      reports that she has never smoked. She has never used smokeless tobacco. She reports current alcohol use. She reports that she does not use drugs. family history includes Diabetes in her mother; Hyperlipidemia in her father; Hypertension in her brother, father, maternal grandmother, mother, and sister; Kidney failure in her father and sister. No Known  Allergies    Outpatient Encounter Medications as of 11/15/2022  Medication Sig   famotidine (PEPCID) 40 MG/5ML suspension Take by mouth daily.   cyclobenzaprine (FLEXERIL) 5 MG tablet Take 1 tablet (5 mg total) by mouth 3 (three) times daily as needed for muscle spasms. (Patient not taking: Reported on 11/15/2022)   diclofenac (VOLTAREN) 75 MG EC tablet Take 1 tablet (75 mg total) by mouth 2 (two) times daily. (Patient not taking: Reported on 11/15/2022)   ibuprofen (ADVIL) 200 MG tablet Take 400-600 mg by mouth every 6 (six) hours as needed for headache or moderate pain. (Patient not taking: Reported on 11/15/2022)   meloxicam (MOBIC) 15 MG tablet Take 1 tablet (15 mg total) by mouth daily. (Patient not taking: Reported on 11/15/2022)   No facility-administered encounter medications on file as of 11/15/2022.     REVIEW OF SYSTEMS  : All other systems reviewed and negative except where noted in the History of Present Illness.   PHYSICAL EXAM: BP 118/76   Pulse 81   Ht 5' 3.5" (1.613 m)   Wt 277 lb (125.6 kg)   BMI 48.30 kg/m  General: Well developed female in no acute distress Head: Normocephalic and atraumatic Eyes:  Sclerae anicteric, conjunctiva pink. Ears: Normal auditory  acuity Lungs: Clear throughout to auscultation; no W/R/R. Heart: Regular rate and rhythm; no M/R/G. Abdomen: Soft, non-distended.  BS present.  Non-tender. Rectal:  Will b done at the time of colonoscopy. Musculoskeletal: Symmetrical with no gross deformities  Skin: No lesions on visible extremities Extremities: No edema  Neurological: Alert oriented x 4, grossly non-focal Psychological:  Alert and cooperative. Normal mood and affect  ASSESSMENT AND PLAN: *IDA: This has been an issue on and off for quite some time.  Has seen hematology in the past and was seen recently.  Needed packed red blood cell transfusion with 1 unit for hemoglobin 7.4 g couple months ago.  Also has received IV iron infusions recently.  This is  likely due to her heavy menses.  She is following with GYN.  No overt sign of gastrointestinal bleeding. *Nausea and GERD: Reflux really more so of an issue just over the past couple of months.  Using Pepcid couple of times daily with mild improvement.  -Will schedule for both EGD and colonoscopy with Dr. Chales Abrahams.  She has never had colonoscopy in the past. -Will begin pantoprazole 40 mg daily.  Prescription sent to pharmacy.   CC:  Jarold Motto, PA CC:  Dr. Angelene Giovanni

## 2022-11-15 NOTE — Patient Instructions (Signed)
We have sent the following medications to your pharmacy for you to pick up at your convenience: Pantoprazole 40 mg daily 30-60 minutes before breakfast.   You have been scheduled for an endoscopy and colonoscopy. Please follow the written instructions given to you at your visit today. Please pick up your prep supplies at the pharmacy within the next 1-3 days. If you use inhalers (even only as needed), please bring them with you on the day of your procedure.  _______________________________________________________  If your blood pressure at your visit was 140/90 or greater, please contact your primary care physician to follow up on this.  _______________________________________________________  If you are age 33 or older, your body mass index should be between 23-30. Your Body mass index is 48.3 kg/m. If this is out of the aforementioned range listed, please consider follow up with your Primary Care Provider.  If you are age 7 or younger, your body mass index should be between 19-25. Your Body mass index is 48.3 kg/m. If this is out of the aformentioned range listed, please consider follow up with your Primary Care Provider.   ________________________________________________________  The [facility] providers would like to encourage you to use [facility] to communicate with providers for non-urgent requests or questions.  Due to long hold times on the telephone, sending your provider a message by [facility] may be a faster and more efficient way to get a response.  Please allow 48 business hours for a response.  Please remember that this is for non-urgent requests.  _______________________________________________________

## 2022-11-16 ENCOUNTER — Telehealth: Payer: Self-pay | Admitting: Gastroenterology

## 2022-11-17 ENCOUNTER — Ambulatory Visit: Payer: No Typology Code available for payment source | Admitting: Gastroenterology

## 2022-11-17 ENCOUNTER — Encounter: Payer: Self-pay | Admitting: Gastroenterology

## 2022-11-17 VITALS — BP 123/82 | HR 61 | Temp 98.9°F | Resp 21 | Ht 63.5 in | Wt 277.0 lb

## 2022-11-17 DIAGNOSIS — K219 Gastro-esophageal reflux disease without esophagitis: Secondary | ICD-10-CM

## 2022-11-17 DIAGNOSIS — K229 Disease of esophagus, unspecified: Secondary | ICD-10-CM | POA: Diagnosis not present

## 2022-11-17 DIAGNOSIS — K2980 Duodenitis without bleeding: Secondary | ICD-10-CM | POA: Diagnosis not present

## 2022-11-17 DIAGNOSIS — R110 Nausea: Secondary | ICD-10-CM

## 2022-11-17 DIAGNOSIS — D509 Iron deficiency anemia, unspecified: Secondary | ICD-10-CM

## 2022-11-17 DIAGNOSIS — K2950 Unspecified chronic gastritis without bleeding: Secondary | ICD-10-CM | POA: Diagnosis not present

## 2022-11-17 MED ORDER — SODIUM CHLORIDE 0.9 % IV SOLN
500.0000 mL | Freq: Once | INTRAVENOUS | Status: DC
Start: 2022-11-17 — End: 2022-11-17

## 2022-11-17 MED ORDER — PANTOPRAZOLE SODIUM 40 MG PO TBEC: 40.0000 mg | DELAYED_RELEASE_TABLET | Freq: Every day | ORAL | 4 refills | Status: DC

## 2022-11-17 NOTE — Telephone Encounter (Signed)
Patient called this evening. She is scheduled for EGD/colonoscopy on 5/8. Patient began her menstrual cycle today and is wondering if it is still safe for her to undergo her procedures. Her menstrual cycle has started sooner than would normally occur by a few days but at times she can be irregular. I discussed with her that as long as she was feeling well that she would be able to complete her preparation and cleanout for her procedures that are scheduled tomorrow. If she wanted to reschedule we could send that along to Dr. Urban Gibson team as well. She felt that she could move forward with her procedure as scheduled on 5/8. I will forward the information to Dr. Chales Abrahams so he is aware. She was appreciative for the call back.   Corliss Parish, MD [facility] Office # [phone]

## 2022-11-17 NOTE — Patient Instructions (Addendum)
Use Protonix 40 mg by mouth daily.  Medication has been sent to pharmacy.  If still with reflux, add Pepcid 20 mg by mouth nightly.  Repeat colonoscopy in 10 years for screening purposes.  YOU HAD AN ENDOSCOPIC PROCEDURE TODAY AT THE [facility]:   Refer to the procedure report that was given to you for any specific questions about what was found during the examination.  If the procedure report does not answer your questions, please call your gastroenterologist to clarify.  If you requested that your care partner not be given the details of your procedure findings, then the procedure report has been included in a sealed envelope for you to review at your convenience later.  YOU SHOULD EXPECT: Some feelings of bloating in the abdomen. Passage of more gas than usual.  Walking can help get rid of the air that was put into your GI tract during the procedure and reduce the bloating. If you had a lower endoscopy (such as a colonoscopy or flexible sigmoidoscopy) you may notice spotting of blood in your stool or on the toilet paper. If you underwent a bowel prep for your procedure, you may not have a normal bowel movement for a few days.  Please Note:  You might notice some irritation and congestion in your nose or some drainage.  This is from the oxygen used during your procedure.  There is no need for concern and it should clear up in a day or so.  SYMPTOMS TO REPORT IMMEDIATELY:  Following lower endoscopy (colonoscopy or flexible sigmoidoscopy):  Excessive amounts of blood in the stool  Significant tenderness or worsening of abdominal pains  Swelling of the abdomen that is new, acute  Fever of 100F or higher  Following upper endoscopy (EGD)  Vomiting of blood or coffee ground material  New chest pain or pain under the shoulder blades  Painful or persistently difficult swallowing  New shortness of breath  Fever of 100F or higher  Black, tarry-looking stools  For urgent or emergent  issues, a gastroenterologist can be reached at any hour by calling [phone]. Do not use MyChart messaging for urgent concerns.    DIET:  We do recommend a small meal at first, but then you may proceed to your regular diet.  Drink plenty of fluids but you should avoid alcoholic beverages for 24 hours.  ACTIVITY:  You should plan to take it easy for the rest of today and you should NOT DRIVE or use heavy machinery until tomorrow (because of the sedation medicines used during the test).    FOLLOW UP: Our staff will call the number listed on your records the next business day following your procedure.  We will call around 7:15- 8:00 am to check on you and address any questions or concerns that you may have regarding the information given to you following your procedure. If we do not reach you, we will leave a message.     If any biopsies were taken you will be contacted by phone or by letter within the next 1-3 weeks.  Please call us at [phone] if you have not heard about the biopsies in 3 weeks.    SIGNATURES/CONFIDENTIALITY: You and/or your care partner have signed paperwork which will be entered into your electronic medical record.  These signatures attest to the fact that that the information above on your After Visit Summary has been reviewed and is understood.  Full responsibility of the confidentiality of this discharge information lies  with you and/or your care-partner.

## 2022-11-17 NOTE — Op Note (Signed)
[facility] Patient Name: Sherri Lindsey Procedure Date: 11/17/2022 10:53 AM MRN: [id number] Endoscopist: Lynann Bologna , MD, [id number] Age: 47 Referring MD:  Date of Birth: 12/19/1975 Gender: Female Account #: [id number] Procedure:                Upper GI endoscopy Indications:              Iron deficiency anemia. GERD Medicines:                Monitored Anesthesia Care Procedure:                Pre-Anesthesia Assessment:                           - Prior to the procedure, a History and Physical                            was performed, and patient medications and                            allergies were reviewed. The patient's tolerance of                            previous anesthesia was also reviewed. The risks                            and benefits of the procedure and the sedation                            options and risks were discussed with the patient.                            All questions were answered, and informed consent                            was obtained. Prior Anticoagulants: The patient has                            taken no anticoagulant or antiplatelet agents. ASA                            Grade Assessment: II - A patient with mild systemic                            disease. After reviewing the risks and benefits,                            the patient was deemed in satisfactory condition to                            undergo the procedure.                           After obtaining informed consent, the endoscope was  passed under direct vision. Throughout the                            procedure, the patient's blood pressure, pulse, and                            oxygen saturations were monitored continuously. The                            Olympus Scope [id number] was introduced through the                            mouth, and advanced to the second part of duodenum.                            The upper GI endoscopy was  accomplished without                            difficulty. The patient tolerated the procedure                            well. Scope In: Scope Out: Findings:                 The distal esophagus was moderately tortuous and                            foreshortened d/t HH.                           LA Grade A (one or more mucosal breaks less than 5                            mm, not extending between tops of 2 mucosal folds)                            esophagitis with no bleeding was found 33 cm from                            the incisors. There was a wide open distal                            esophageal stricture at GE junction 33 cm from the                            incisors with luminal diameter of approximately 14                            mm (not dilated since patient not having dysphagia)                            Multiple biopsies were taken with a cold forceps  for histology.                           A 5 cm hiatal hernia was present extending from 33                            up to 38 cm (GE junction up to diaphragmatic                            hiatus). No [place] erosions. No active bleeding.                           Localized mild inflammation characterized by                            erythema was found in the gastric antrum. Biopsies                            were taken with a cold forceps for histology.                           Localized nodular mucosa was found in the duodenal                            bulb and in the first portion of the duodenum.                            Likely hypertrophied Brunner's glands. Biopsies                            were taken with a cold forceps for histology.                           The exam of the duodenum was otherwise normal.                            Multiple biopsies were obtained from the second                            portion of the duodenum and sent for histology to                             rule out celiac disease. Complications:            No immediate complications. Estimated Blood Loss:     Estimated blood loss: none. Healing Impression:               - LA Grade A reflux esophagitis with no bleeding.                            Biopsied.                           - 5 cm hiatal hernia.                           -  Gastritis. Biopsied.                           - Nodular mucosa in the duodenal bulb-likely                            hypertrophied Brunner's glands. Biopsied. Recommendation:           - Patient has a contact number available for                            emergencies. The signs and symptoms of potential                            delayed complications were discussed with the                            patient. Return to normal activities tomorrow.                            Written discharge instructions were provided to the                            patient.                           - Resume previous diet.                           - Continue present medications.                           - Use Protonix (pantoprazole) 40 mg PO daily #90, 4                            refills. If still with reflux, add Pepcid 20 mg                            p.o. nightly                           - Proceed with colonoscopy.                           - The findings and recommendations were discussed                            with the patient's family. Lynann Bologna, MD 11/17/2022 11:29:13 AM This report has been signed electronically.

## 2022-11-17 NOTE — Progress Notes (Signed)
Called to room to assist during endoscopic procedure.  Patient ID and intended procedure confirmed with present staff. Received instructions for my participation in the procedure from the performing physician.  

## 2022-11-17 NOTE — Telephone Encounter (Signed)
Thanks Gabe RG 

## 2022-11-17 NOTE — Op Note (Signed)
[facility] Patient Name: Sherri Lindsey Procedure Date: 11/17/2022 10:53 AM MRN: [id number] Endoscopist: Lynann Bologna , MD, [id number] Age: 47 Referring MD:  Date of Birth: [birth year] Gender: Female Account #: [id number] Procedure:                Colonoscopy Indications:              Iron deficiency anemia/CRC screening. Medicines:                Monitored Anesthesia Care Procedure:                Pre-Anesthesia Assessment:                           - Prior to the procedure, a History and Physical                            was performed, and patient medications and                            allergies were reviewed. The patient's tolerance of                            previous anesthesia was also reviewed. The risks                            and benefits of the procedure and the sedation                            options and risks were discussed with the patient.                            All questions were answered, and informed consent                            was obtained. Prior Anticoagulants: The patient has                            taken no anticoagulant or antiplatelet agents. ASA                            Grade Assessment: II - A patient with mild systemic                            disease. After reviewing the risks and benefits,                            the patient was deemed in satisfactory condition to                            undergo the procedure.                           After obtaining informed consent, the colonoscope  was passed under direct vision. Throughout the                            procedure, the patient's blood pressure, pulse, and                            oxygen saturations were monitored continuously. The                            Olympus CF-HQ190L SN [id number] was introduced through                            the anus and advanced to the 2 cm into the ileum.                            The colonoscopy was  performed without difficulty.                            The patient tolerated the procedure well. The                            quality of the bowel preparation was good. The                            terminal ileum, ileocecal valve, appendiceal                            orifice, and rectum were photographed. Scope In: 11:09:29 AM Scope Out: 11:23:38 AM Scope Withdrawal Time: 0 hours 9 minutes 14 seconds  Total Procedure Duration: 0 hours 14 minutes 9 seconds  Findings:                 A few small-mouthed diverticula were found in the                            sigmoid colon.                           Non-bleeding internal hemorrhoids were found during                            retroflexion. The hemorrhoids were small and Grade                            I (internal hemorrhoids that do not prolapse).                           The exam was otherwise without abnormality on                            direct and retroflexion views. Terminal ileum was                            normal. Complications:  No immediate complications. Estimated Blood Loss:     Estimated blood loss: none. Impression:               - Mild sigmoid diverticulosis.                           - Non-bleeding internal hemorrhoids.                           - The examination was otherwise normal on direct                            and retroflexion views. Normal terminal ileum.                           - No specimens collected. Recommendation:           - Patient has a contact number available for                            emergencies. The signs and symptoms of potential                            delayed complications were discussed with the                            patient. Return to normal activities tomorrow.                            Written discharge instructions were provided to the                            patient.                           - Resume previous diet.                           -  Continue present medications.                           - Repeat colonoscopy in 10 years for screening                            purposes. Earlier, if with any new problems or                            change in family history.                           - Likely etiology of her IDA is heavy menstrual                            cycles due to known uterine fibroids. She would                            follow-up  with GYN.                           - The findings and recommendations were discussed                            with the patient's family. Lynann Bologna, MD 11/17/2022 11:32:22 AM This report has been signed electronically.

## 2022-11-17 NOTE — Progress Notes (Signed)
Vss nad trans to pacu 

## 2022-11-17 NOTE — Progress Notes (Signed)
11/15/2022 Sherri Lindsey [id number] 1975-09-27     HISTORY OF PRESENT ILLNESS: This is a 47 year old female who has been referred here by Dr. Angelene Giovanni with hematology/oncology for evaluation of iron deficiency anemia.  She tells me that she has had issues with anemia on and off over the years.  A couple of months ago her hemoglobin is 7.4 g that she received a unit of packed red blood cells.  Also recently received IV iron infusions.  Iron levels were very low.  She tells that she does have heavy menses and is following with GYN as well.  She denies any black or bloody stools.  Never had EGD or colonoscopy in the past.   She says that as of late she has been having issues with a lot of indigestion or reflux.  She said really more so over the past couple of months.  Has been taking Pepcid twice a day, but no matter what she eats or drinks she has a burning sensation.  Even drinking a bottle of water will cause this.  She has been getting a lot of random nausea as well.  She tells me that she thinks she had an endoscopy when she was 73 or 47 years old and was told that she had an ulcer, but no issues since then.  She was using NSAIDs for her back, but none of them helped so she has not continued them.         Past Medical History:  Diagnosis Date   Edema 2014    intermittent since 2014, diuretic use prior   Gestational diabetes 2002   Migraine     Strabismus     Ulcer     Wears glasses           Past Surgical History:  Procedure Laterality Date   CESAREAN SECTION        2002, 2005   CHOLECYSTECTOMY       KNEE SURGERY        arthroscopic   TONSILLECTOMY         reports that she has never smoked. She has never used smokeless tobacco. She reports current alcohol use. She reports that she does not use drugs. family history includes Diabetes in her mother; Hyperlipidemia in her father; Hypertension in her brother, father, maternal grandmother, mother, and sister; Kidney failure in her  father and sister. No Known Allergies         Outpatient Encounter Medications as of 11/15/2022  Medication Sig   famotidine (PEPCID) 40 MG/5ML suspension Take by mouth daily.   cyclobenzaprine (FLEXERIL) 5 MG tablet Take 1 tablet (5 mg total) by mouth 3 (three) times daily as needed for muscle spasms. (Patient not taking: Reported on 11/15/2022)   diclofenac (VOLTAREN) 75 MG EC tablet Take 1 tablet (75 mg total) by mouth 2 (two) times daily. (Patient not taking: Reported on 11/15/2022)   ibuprofen (ADVIL) 200 MG tablet Take 400-600 mg by mouth every 6 (six) hours as needed for headache or moderate pain. (Patient not taking: Reported on 11/15/2022)   meloxicam (MOBIC) 15 MG tablet Take 1 tablet (15 mg total) by mouth daily. (Patient not taking: Reported on 11/15/2022)    No facility-administered encounter medications on file as of 11/15/2022.        REVIEW OF SYSTEMS  : All other systems reviewed and negative except where noted in the History of Present Illness.     PHYSICAL EXAM: BP 118/76  Pulse 81   Ht 5' 3.5" (1.613 m)   Wt 277 lb (125.6 kg)   BMI 48.30 kg/m  General: Well developed female in no acute distress Head: Normocephalic and atraumatic Eyes:  Sclerae anicteric, conjunctiva pink. Ears: Normal auditory acuity Lungs: Clear throughout to auscultation; no W/R/R. Heart: Regular rate and rhythm; no M/R/G. Abdomen: Soft, non-distended.  BS present.  Non-tender. Rectal:  Will b done at the time of colonoscopy. Musculoskeletal: Symmetrical with no gross deformities  Skin: No lesions on visible extremities Extremities: No edema  Neurological: Alert oriented x 4, grossly non-focal Psychological:  Alert and cooperative. Normal mood and affect   ASSESSMENT AND PLAN: *IDA: This has been an issue on and off for quite some time.  Has seen hematology in the past and was seen recently.  Needed packed red blood cell transfusion with 1 unit for hemoglobin 7.4 g couple months ago.  Also has  received IV iron infusions recently.  This is likely due to her heavy menses.  She is following with GYN.  No overt sign of gastrointestinal bleeding. *Nausea and GERD: Reflux really more so of an issue just over the past couple of months.  Using Pepcid couple of times daily with mild improvement.   -Will schedule for both EGD and colonoscopy with Dr. Chales Abrahams.  She has never had colonoscopy in the past. -Will begin pantoprazole 40 mg daily.  Prescription sent to pharmacy.      Attending physician's note   I have taken history, reviewed the chart and examined the patient. I performed a substantive portion of this encounter, including complete performance of at least one of the key components, in conjunction with the APP. I agree with the Advanced Practitioner's note, impression and recommendations.    Edman Circle, MD [facility] [phone]

## 2022-11-18 ENCOUNTER — Telehealth: Payer: Self-pay

## 2022-11-18 NOTE — Telephone Encounter (Signed)
  Follow up Call-     11/17/2022   10:33 AM  Call back number  Post procedure Call Back phone  # [phone]  Permission to leave phone message Yes     Patient questions:  Do you have a fever, pain , or abdominal swelling? No. Pain Score  0 *  Have you tolerated food without any problems? Yes.    Have you been able to return to your normal activities? Yes.    Do you have any questions about your discharge instructions: Diet   No. Medications  No. Follow up visit  No.  Do you have questions or concerns about your Care? No.  Actions: * If pain score is 4 or above: No action needed, pain <4.

## 2022-11-25 ENCOUNTER — Other Ambulatory Visit: Payer: Self-pay | Admitting: Oncology

## 2022-11-25 DIAGNOSIS — D500 Iron deficiency anemia secondary to blood loss (chronic): Secondary | ICD-10-CM

## 2022-11-25 NOTE — Progress Notes (Deleted)
[facility] Follow up Visit:  Patient Care Team: Jarold Motto, [place] as PCP - General (Physician Assistant)  CHIEF COMPLAINTS/PURPOSE OF CONSULTATION:  HISTORY OF PRESENTING ILLNESS: Sherri Lindsey 47 y.o. female is here because of anemia Medical history notable for gestational diabetes, migraine, strabismus, low back pain, morbid obesity  September 15, 2020: Pelvic ultrasound notable for fibroid uterus  August 25, 2022: WBC 6.3 hemoglobin 7.4 MCV 57 platelet count 37; 54 segs 37 lymphs 8 monos 1 EO 1 basophil Ferritin 3.1 H. pylori IgG negative  August 27, 2022: [facility] Hematology Consult  Patient is G2 P2 Menopause not reached.  Menses occur monthly and last 6 to 7 days and are regular.  Bleeding is heavy.  Does not have bleeding between periods.    Does no pass clots.  Last pregnancy was delivered by C section in 2008.  Patient has uterine fibroids.  Does not take oral iron regularly.  Has received IV iron in the past without complication.  Has not required PRBC's in the past.  Has constipation with oral iron.  Has a normal diet.  No history of postpartum hemorrhage or hemorrhage after surgery.  Occasional hematochezia.  No melena, hemoptysis, hematuria.  No history of intra-articular or soft tissue bleeding.  No history of abnormal bleeding in family members.  Patient has symptoms of fatigue, pallor,  DOE, decreased performance status.  Patient has pica to ice but not to starch/dirt.    To see Gynecology on September 24 2022.  Will arrange for IV iron and GI appointment  WBC 6.3 hemoglobin 7.4 MCV 57 platelet count 337; 54 segs 37 lymphs 8 monos 1 EO 1 basophil Hemoglobin electrophoresis normal adult pattern Factor VIII 266 von Willebrand factor antigen 180 ristocetin cofactor 96 fibrinogen 475 INR 1.0 PTT 31 Ferritin 3.1 folate 344 B12 218 CMP notable for glucose of 142  September 03, 2022:  Venofer 200 mg  October 01 2022:  Scheduled follow up for management of  anemia.  Reviewed results of labs with patient.  Feels better but still a bit winded.  Has not seen Gynecology because the visit was scheduled during her menses.  Waiting to hear when her new appointment will be.  Ice pica better but still there.   Arrange for the additional 4 doses of IV iron  Ferritin 2.  Hgb 8.0 MCV 63  Review of Systems - Oncology  MEDICAL HISTORY: Past Medical History:  Diagnosis Date   Edema 2014   intermittent since 2014, diuretic use prior   Gestational diabetes 2002   Migraine    Strabismus    Ulcer    Wears glasses     SURGICAL HISTORY: Past Surgical History:  Procedure Laterality Date   CESAREAN SECTION     2002, 2005   CHOLECYSTECTOMY     KNEE SURGERY     arthroscopic   TONSILLECTOMY      SOCIAL HISTORY: Social History   Socioeconomic History   Marital status: Single    Spouse name: Not on file   Number of children: 2   Years of education: Not on file   Highest education level: Not on file  Occupational History   Occupation: customer service  Tobacco Use   Smoking status: Never   Smokeless tobacco: Never  Vaping Use   Vaping Use: Never used  Substance and Sexual Activity   Alcohol use: Yes    Comment: rarely   Drug use: No   Sexual activity: Not Currently  Other Topics Concern   Not on file  Social History Narrative   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, mom is currently independent   Social Determinants of Health   Financial Resource Strain: Low Risk  (08/27/2022)   Overall Financial Resource Strain (CARDIA)    Difficulty of Paying Living Expenses: Not hard at all  Food Insecurity: No Food Insecurity (08/27/2022)   Hunger Vital Sign    Worried About Running Out of Food in the Last Year: Never true    Ran Out of Food in the Last Year: Never true  Transportation Needs: No Transportation Needs (08/27/2022)   PRAPARE - Administrator, Civil Service (Medical): No    Lack of  Transportation (Non-Medical): No  Physical Activity: Not on file  Stress: Not on file  Social Connections: Not on file  Intimate Partner Violence: Not At Risk (08/27/2022)   Humiliation, Afraid, Rape, and Kick questionnaire    Fear of Current or Ex-Partner: No    Emotionally Abused: No    Physically Abused: No    Sexually Abused: No    FAMILY HISTORY Family History  Problem Relation Age of Onset   Hypertension Mother    Diabetes Mother    Hypertension Father    Kidney failure Father    Hyperlipidemia Father    Hypertension Sister    Kidney failure Sister    Hypertension Brother    Hypertension Maternal Grandmother    Heart disease Neg Hx    Stomach cancer Neg Hx    Rectal cancer Neg Hx    Esophageal cancer Neg Hx    Colon cancer Neg Hx     ALLERGIES:  has No Known Allergies.  MEDICATIONS:  Current Outpatient Medications  Medication Sig Dispense Refill   cyclobenzaprine (FLEXERIL) 5 MG tablet Take 1 tablet (5 mg total) by mouth 3 (three) times daily as needed for muscle spasms. (Patient not taking: Reported on 11/15/2022) 30 tablet 0   diclofenac (VOLTAREN) 75 MG EC tablet Take 1 tablet (75 mg total) by mouth 2 (two) times daily. (Patient not taking: Reported on 11/15/2022) 30 tablet 0   famotidine (PEPCID) 40 MG/5ML suspension Take by mouth daily.     ibuprofen (ADVIL) 200 MG tablet Take 400-600 mg by mouth every 6 (six) hours as needed for headache or moderate pain. (Patient not taking: Reported on 11/15/2022)     meloxicam (MOBIC) 15 MG tablet Take 1 tablet (15 mg total) by mouth daily. (Patient not taking: Reported on 11/15/2022) 30 tablet 0   pantoprazole (PROTONIX) 40 MG tablet Take 1 tablet (40 mg total) by mouth daily. 90 tablet 4   No current facility-administered medications for this visit.    PHYSICAL EXAMINATION:  ECOG PERFORMANCE STATUS: 1 - Symptomatic but completely ambulatory   There were no vitals filed for this visit.   There were no vitals filed for this  visit.    Physical Exam Vitals and nursing note reviewed.  Constitutional:      General: She is not in acute distress.    Appearance: Normal appearance. She is obese. She is not ill-appearing, toxic-appearing or diaphoretic.  HENT:     Head: Normocephalic and atraumatic.     Right Ear: External ear normal.     Left Ear: External ear normal.     Nose: Nose normal. No congestion or rhinorrhea.  Eyes:     General: No scleral icterus.    Extraocular Movements:  Extraocular movements intact.     Conjunctiva/sclera: Conjunctivae normal.     Pupils: Pupils are equal, round, and reactive to light.  Cardiovascular:     Rate and Rhythm: Normal rate.     Heart sounds: No murmur heard.    No friction rub. No gallop.  Pulmonary:     Effort: Pulmonary effort is normal. No respiratory distress.     Breath sounds: Normal breath sounds. No wheezing.  Abdominal:     General: Bowel sounds are normal.     Palpations: Abdomen is soft.     Tenderness: There is no abdominal tenderness. There is no guarding or rebound.  Musculoskeletal:        General: No swelling, tenderness or deformity.     Cervical back: Normal range of motion and neck supple. No rigidity or tenderness.  Lymphadenopathy:     Head:     Right side of head: No submental, submandibular, tonsillar, preauricular, posterior auricular or occipital adenopathy.     Left side of head: No submental, submandibular, tonsillar, preauricular, posterior auricular or occipital adenopathy.     Cervical: No cervical adenopathy.     Right cervical: No superficial, deep or posterior cervical adenopathy.    Left cervical: No superficial, deep or posterior cervical adenopathy.     Upper Body:     Right upper body: No supraclavicular, axillary, pectoral or epitrochlear adenopathy.     Left upper body: No supraclavicular, axillary, pectoral or epitrochlear adenopathy.  Skin:    General: Skin is warm.     Coloration: Skin is not jaundiced.      Findings: No erythema or rash.  Neurological:     General: No focal deficit present.     Mental Status: She is alert and oriented to person, place, and time.     Cranial Nerves: No cranial nerve deficit.     Motor: No weakness.  Psychiatric:        Mood and Affect: Mood normal.        Behavior: Behavior normal.        Thought Content: Thought content normal.        Judgment: Judgment normal.      LABORATORY DATA: I have personally reviewed the data as listed:  No visits with results within 1 Month(s) from this visit.  Latest known visit with results is:  Appointment on 10/01/2022  Component Date Value Ref Range Status   Ferritin 10/01/2022 3 (L)  11 - 307 ng/mL Final   Performed at [facility], [address] [place], [place] [address]   Iron 10/01/2022 10 (L)  28 - 170 ug/dL Final   TIBC 95/62/1308 463 (H)  250 - 450 ug/dL Final   Saturation Ratios 10/01/2022 2 (L)  10.4 - 31.8 % Final   UIBC 10/01/2022 453 (H)  148 - 442 ug/dL Final   Performed at [facility], [address] [place], [place] [address]   Sodium 10/01/2022 138  135 - 145 mmol/L Final   Potassium 10/01/2022 3.8  3.5 - 5.1 mmol/L Final   Chloride 10/01/2022 105  98 - 111 mmol/L Final   CO2 10/01/2022 24  22 - 32 mmol/L Final   Glucose, Bld 10/01/2022 102 (H)  70 - 99 mg/dL Final   Glucose reference range applies only to samples taken after fasting for at least 8 hours.   BUN 10/01/2022 7  6 - 20 mg/dL Final   Creatinine 69/62/9528 0.95  0.44 - 1.00  mg/dL Final   Calcium 19/14/7829 9.1  8.9 - 10.3 mg/dL Final   Total Protein 56/21/3086 7.9  6.5 - 8.1 g/dL Final   Albumin 57/84/6962 4.3  3.5 - 5.0 g/dL Final   AST 95/28/4132 12 (L)  15 - 41 U/L Final   ALT 10/01/2022 7  0 - 44 U/L Final   Alkaline Phosphatase 10/01/2022 69  38 - 126 U/L Final   Total Bilirubin 10/01/2022 0.6  0.3 - 1.2 mg/dL Final   GFR, Estimated 10/01/2022 >60  >60 mL/min Final   Comment:  (NOTE) Calculated using the CKD-EPI Creatinine Equation (2021)    Anion gap 10/01/2022 9  5 - 15 Final   Performed at [facility], [address] [place], [place] [address]   WBC Count 10/01/2022 4.7  4.0 - 10.5 K/uL Final   RBC 10/01/2022 4.71  3.87 - 5.11 MIL/uL Final   Hemoglobin 10/01/2022 8.0 (L)  12.0 - 15.0 g/dL Final   Comment: Reticulocyte Hemoglobin testing may be clinically indicated, consider ordering this additional test [id number]    HCT 10/01/2022 29.5 (L)  36.0 - 46.0 % Final   MCV 10/01/2022 62.6 (L)  80.0 - 100.0 fL Final   MCH 10/01/2022 17.0 (L)  26.0 - 34.0 pg Final   MCHC 10/01/2022 27.1 (L)  30.0 - 36.0 g/dL Final   RDW 40/34/7425 23.0 (H)  11.5 - 15.5 % Final   Platelet Count 10/01/2022 342  150 - 400 K/uL Final   nRBC 10/01/2022 0.0  0.0 - 0.2 % Final   Neutrophils Relative % 10/01/2022 61  % Final   Neutro Abs 10/01/2022 2.9  1.7 - 7.7 K/uL Final   Lymphocytes Relative 10/01/2022 29  % Final   Lymphs Abs 10/01/2022 1.4  0.7 - 4.0 K/uL Final   Monocytes Relative 10/01/2022 8  % Final   Monocytes Absolute 10/01/2022 0.4  0.1 - 1.0 K/uL Final   Eosinophils Relative 10/01/2022 1  % Final   Eosinophils Absolute 10/01/2022 0.1  0.0 - 0.5 K/uL Final   Basophils Relative 10/01/2022 1  % Final   Basophils Absolute 10/01/2022 0.1  0.0 - 0.1 K/uL Final   WBC Morphology 10/01/2022 MORPHOLOGY UNREMARKABLE   Final   Smear Review 10/01/2022 LARGE PLTS SEEN   Final   Immature Granulocytes 10/01/2022 0  % Final   Abs Immature Granulocytes 10/01/2022 0.01  0.00 - 0.07 K/uL Final   Polychromasia 10/01/2022 PRESENT   Final   Target Cells 10/01/2022 PRESENT   Final   Spherocytes 10/01/2022 PRESENT   Final   Ovalocytes 10/01/2022 PRESENT   Final   Performed at [facility], [address] [place], [place] [address]    RADIOGRAPHIC STUDIES: I have personally reviewed the radiological images as listed and agree with the  findings in the report  No results found.  ASSESSMENT/PLAN  47 y.o. female is here because of anemia  Medical history notable for gestational diabetes, migraine, strabismus, low back pain, morbid obesity  Anemia:  Due to iron deficiency anemia owing to dysfunctional uterine bleeding/ exacerbated by high demand from prior pregnancies.   September 03, 2022:  Venofer 200 mg   October 01 2022- Ferritin 2.  Hgb 8.0 MCV 63.  Will arrange for 4 additional doses of Venofer  Dysfunctional uterine bleeding:  This is characterized by menses that are prolonged, irregular as well as by heavy bleeding with passage of clots.    August 27 2022- No evidence  of bleeding disorder.  PLT count normal,.  PT, PTT, Fibrinogen, von Willebrand screen normal October 01 2022- To see gynecology for evaluation and management    Cancer Staging  No matching staging information was found for the patient.   No problem-specific Assessment & Plan notes found for this encounter.   No orders of the defined types were placed in this encounter.  30  minutes was spent in patient care.  This included time spent preparing to see the patient (e.g., review of tests), obtaining and/or reviewing separately obtained history, counseling and educating the patient, ordering medications, tests, or procedures; documenting clinical information in the electronic or other health record, independently interpreting results and communicating results to the patient as well as coordination of care.      All questions were answered. The patient knows to call the clinic with any problems, questions or concerns.  This note was electronically signed.    Loni Muse, MD  11/25/2022 4:50 PM

## 2022-11-26 ENCOUNTER — Inpatient Hospital Stay: Payer: No Typology Code available for payment source

## 2022-11-26 ENCOUNTER — Telehealth: Payer: Self-pay | Admitting: Oncology

## 2022-11-26 ENCOUNTER — Inpatient Hospital Stay: Payer: No Typology Code available for payment source | Admitting: Oncology

## 2022-12-09 ENCOUNTER — Encounter: Payer: Self-pay | Admitting: Gastroenterology

## 2022-12-10 ENCOUNTER — Inpatient Hospital Stay: Payer: No Typology Code available for payment source

## 2022-12-10 ENCOUNTER — Inpatient Hospital Stay: Payer: No Typology Code available for payment source | Admitting: Oncology

## 2023-01-20 ENCOUNTER — Telehealth (INDEPENDENT_AMBULATORY_CARE_PROVIDER_SITE_OTHER): Payer: No Typology Code available for payment source | Admitting: Physician Assistant

## 2023-01-20 DIAGNOSIS — M5441 Lumbago with sciatica, right side: Secondary | ICD-10-CM

## 2023-01-20 MED ORDER — PREDNISONE 20 MG PO TABS: 20.0000 mg | ORAL_TABLET | Freq: Two times a day (BID) | ORAL | 0 refills | Status: DC

## 2023-01-20 MED ORDER — METHOCARBAMOL 500 MG PO TABS: 500.0000 mg | ORAL_TABLET | Freq: Three times a day (TID) | ORAL | 0 refills | Status: DC | PRN

## 2023-01-20 NOTE — Progress Notes (Signed)
Virtual Visit via Video Note   I, Jarold Motto, connected with  Sherri Lindsey  ([id number], [birth year]) on 01/20/23 at 11:00 AM EDT by a video-enabled telemedicine application and verified that I am speaking with the correct person using two identifiers.  Location: Patient: Home Provider: [place] [facility] [place] [facility]   I discussed the limitations of evaluation and management by telemedicine and the availability of in person appointments. The patient expressed understanding and agreed to proceed.    History of Present Illness: Sherri Lindsey is a 47 y.o. who identifies as a female who was assigned female at birth, and is being seen today for low back pain.  Was seeing  Dr Jean Rosenthal and had xray performed -- showed degenerative disk disease at L3-4 Was given meloxicam and flexeril -- did not help She is unable to do physical therapy due to her work schedule  Stiffness and tightness on right side of low back Getting shooting pain down her right leg  Limited ability to exercise due to pain Denies bowel/bladder incontinence, saddle anesthesia, weakness    Problems:  Patient Active Problem List   Diagnosis Date Noted   Nausea 11/15/2022   Gastroesophageal reflux disease 11/15/2022   Iron deficiency anemia 11/15/2022   Deficiency anemia 08/27/2022   Dysfunctional uterine bleeding 08/27/2022   History of gestational diabetes 08/25/2022   Heartburn 08/25/2022   History of anemia 09/17/2016   Anxiety 09/17/2016    Allergies: No Known Allergies Medications:  Current Outpatient Medications:    famotidine (PEPCID) 40 MG/5ML suspension, Take by mouth daily., Disp: , Rfl:    methocarbamol (ROBAXIN) 500 MG tablet, Take 1 tablet (500 mg total) by mouth every 8 (eight) hours as needed for muscle spasms., Disp: 30 tablet, Rfl: 0   pantoprazole (PROTONIX) 40 MG tablet, Take 1 tablet (40 mg total) by mouth daily., Disp: 90 tablet, Rfl: 4   predniSONE (DELTASONE) 20 MG tablet, Take 1 tablet  (20 mg total) by mouth 2 (two) times daily with a meal., Disp: 10 tablet, Rfl: 0   diclofenac (VOLTAREN) 75 MG EC tablet, Take 1 tablet (75 mg total) by mouth 2 (two) times daily. (Patient not taking: Reported on 11/15/2022), Disp: 30 tablet, Rfl: 0   ibuprofen (ADVIL) 200 MG tablet, Take 400-600 mg by mouth every 6 (six) hours as needed for headache or moderate pain. (Patient not taking: Reported on 11/15/2022), Disp: , Rfl:    meloxicam (MOBIC) 15 MG tablet, Take 1 tablet (15 mg total) by mouth daily. (Patient not taking: Reported on 11/15/2022), Disp: 30 tablet, Rfl: 0  Observations/Objective: Patient is well-developed, well-nourished in no acute distress.  Resting comfortably  at home.  Head is normocephalic, atraumatic.  No labored breathing.  Speech is clear and coherent with logical content.  Patient is alert and oriented at baseline.    Assessment and Plan: 1. Acute right-sided low back pain with right-sided sciatica No red flags on discussion Recommend getting back in with Dr Jean Rosenthal to explore further options Will trial oral prednisone -- no further NSAIDs while taking this Trial alternative muscle relaxant - robaxin - sleep precautions advised Follow-up if new/worsening sx  Follow Up Instructions: I discussed the assessment and treatment plan with the patient. The patient was provided an opportunity to ask questions and all were answered. The patient agreed with the plan and demonstrated an understanding of the instructions.  A copy of instructions were sent to the patient via MyChart unless otherwise noted below.   The patient was  advised to call back or seek an in-person evaluation if the symptoms worsen or if the condition fails to improve as anticipated.  Jarold Motto, [place]

## 2023-01-21 ENCOUNTER — Ambulatory Visit: Payer: No Typology Code available for payment source | Admitting: Radiology

## 2023-01-21 ENCOUNTER — Other Ambulatory Visit (HOSPITAL_COMMUNITY)
Admission: RE | Admit: 2023-01-21 | Discharge: 2023-01-21 | Disposition: A | Discharge status: Home / Self Care | Payer: No Typology Code available for payment source | Source: Ambulatory Visit | Attending: Radiology | Admitting: Radiology

## 2023-01-21 ENCOUNTER — Encounter: Payer: Self-pay | Admitting: Radiology

## 2023-01-21 VITALS — BP 136/94 | Ht 63.25 in | Wt 280.0 lb

## 2023-01-21 DIAGNOSIS — Z113 Encounter for screening for infections with a predominantly sexual mode of transmission: Secondary | ICD-10-CM | POA: Insufficient documentation

## 2023-01-21 DIAGNOSIS — Z01419 Encounter for gynecological examination (general) (routine) without abnormal findings: Secondary | ICD-10-CM

## 2023-01-21 DIAGNOSIS — N920 Excessive and frequent menstruation with regular cycle: Secondary | ICD-10-CM

## 2023-01-21 DIAGNOSIS — Z30011 Encounter for initial prescription of contraceptive pills: Secondary | ICD-10-CM | POA: Diagnosis not present

## 2023-01-21 DIAGNOSIS — R030 Elevated blood-pressure reading, without diagnosis of hypertension: Secondary | ICD-10-CM

## 2023-01-21 MED ORDER — JUNEL FE 24 1-20 MG-MCG(24) PO TABS
1.0000 | ORAL_TABLET | Freq: Every day | ORAL | 0 refills | Status: AC
Start: 2023-01-21 — End: ?

## 2023-01-21 NOTE — Progress Notes (Signed)
Sherri Lindsey 1975/07/20 [id number]   History:  47 y.o. G3P2 presents for annual exam.Referred by PCP due to menorrhagia and anemia. Receives iron infusions. Interested in something to help lighten flow. Normal colonoscopy last month. Has never had a mammogram.   Gynecologic History Patient's last menstrual period was 01/11/2023 (exact date). Period Cycle (Days): 28 Period Duration (Days): 7 Period Pattern: Regular Menstrual Flow: Heavy Menstrual Control: Tampon, Maxi pad, Thin pad Dysmenorrhea: (!) Severe Dysmenorrhea Symptoms: Cramping Contraception/Family planning: none Sexually active: yes Last Pap: 2019. Results were: normal Last mammogram: never  Obstetric History OB History  Gravida Para Term Preterm AB Living  3 2     1 2   SAB IAB Ectopic Multiple Live Births               # Outcome Date GA Lbr Len/2nd Weight Sex Type Anes PTL Lv  3 AB           2 Para           1 Para              The following portions of the patient's history were reviewed and updated as appropriate: allergies, current medications, past family history, past medical history, past social history, past surgical history, and problem list.  Review of Systems Pertinent items noted in HPI and remainder of comprehensive ROS otherwise negative.   Past medical history, past surgical history, family history and social history were all reviewed and documented in the EPIC chart.   Exam:  Vitals:   01/21/23 1354 01/21/23 1402  BP: (!) 140/96 (!) 136/94  Weight: 280 lb (127 kg)   Height: 5' 3.25" (1.607 m)    Body mass index is 49.21 kg/m.  General appearance:  Normal, obese Thyroid:  Symmetrical, normal in size, without palpable masses or nodularity. Respiratory  Auscultation:  Clear without wheezing or rhonchi Cardiovascular  Auscultation:  Regular rate, without rubs, murmurs or gallops  Edema/varicosities:  Not grossly evident Abdominal  Soft,nontender, without masses, guarding or  rebound.  Liver/spleen:  No organomegaly noted  Hernia:  None appreciated  Skin  Inspection:  Grossly normal Breasts: Examined lying and sitting.   Right: Without masses, retractions, nipple discharge or axillary adenopathy.   Left: Without masses, retractions, nipple discharge or axillary adenopathy. Genitourinary   Inguinal/mons:  Normal without inguinal adenopathy  External genitalia:  Normal appearing vulva with no masses, tenderness, or lesions  BUS/Urethra/Skene's glands:  Normal without masses or exudate  Vagina:  Normal appearing with normal color and discharge, no lesions  Cervix:  Normal appearing without discharge or lesions  Uterus:  Normal in size, shape and contour.  Mobile, nontender  Adnexa/parametria:     Rt: Normal in size, without masses or tenderness.   Lt: Normal in size, without masses or tenderness.  Anus and perineum: Normal   Raynelle Fanning, CMA present for exam  Assessment/Plan:   1. Well woman exam with routine gynecological exam - Cytology - PAP( [facility]) - Schedule mammogram  2. Menorrhagia with regular cycle - Norethindrone Acetate-Ethinyl Estrad-FE (JUNEL FE 24) 1-20 MG-MCG(24) tablet; Take 1 tablet by mouth daily.  Dispense: 84 tablet; Refill: 0  3. Oral contraception initiation - Norethindrone Acetate-Ethinyl Estrad-FE (JUNEL FE 24) 1-20 MG-MCG(24) tablet; Take 1 tablet by mouth daily.  Dispense: 84 tablet; Refill: 0  4. Screening for STDs (sexually transmitted diseases) - Cytology - PAP( [facility])  5. Elevated blood-pressure reading without diagnosis of hypertension Has appt with hematology next  week, will recheck then and contact PCP if still high   Risks and benefits of OCPs reviewed. Follow up 3 months Discussed SBE, colonoscopy and DEXA screening as directed/appropriate. Recommend of exercise weekly, including weight bearing exercise. Encouraged the use of seatbelts and sunscreen. Return in 1 year for annual or as needed.    Arlie Solomons B WHNP-BC 2:16 PM 01/21/2023

## 2023-01-25 LAB — CYTOLOGY - PAP
Chlamydia: NEGATIVE
Comment: NEGATIVE
Comment: NEGATIVE
Comment: NEGATIVE
Comment: NORMAL
Diagnosis: NEGATIVE
High risk HPV: NEGATIVE
Neisseria Gonorrhea: NEGATIVE
Trichomonas: NEGATIVE

## 2023-01-27 ENCOUNTER — Other Ambulatory Visit: Payer: Self-pay | Admitting: Oncology

## 2023-01-27 DIAGNOSIS — D539 Nutritional anemia, unspecified: Secondary | ICD-10-CM

## 2023-01-27 NOTE — Progress Notes (Deleted)
[facility] Cancer Initial Visit:  Patient Care Team: Jarold Motto, [place] as PCP - General (Physician Assistant)  CHIEF COMPLAINTS/PURPOSE OF CONSULTATION:  HISTORY OF PRESENTING ILLNESS: Sherri Lindsey 47 y.o. female is here because of anemia Medical history notable for gestational diabetes, migraine, strabismus, low back pain, morbid obesity  September 15, 2020: Pelvic ultrasound notable for fibroid uterus  August 25, 2022: WBC 6.3 hemoglobin 7.4 MCV 57 platelet count 37; 54 segs 37 lymphs 8 monos 1 EO 1 basophil Ferritin 3.1 H. pylori IgG negative  August 27, 2022: [facility]ematology Consult  Patient is G2 P2 Menopause not reached.  Menses occur monthly and last 6 to 7 days and are regular.  Bleeding is heavy.  Does not have bleeding between periods.    Does no pass clots.  Last pregnancy was delivered by C section in 2008.  Patient has uterine fibroids.  Does not take oral iron regularly.  Has received IV iron in the past without complication.  Has not required PRBC's in the past.  Has constipation with oral iron.  Has a normal diet.  No history of postpartum hemorrhage or hemorrhage after surgery.  Occasional hematochezia.  No melena, hemoptysis, hematuria.  No history of intra-articular or soft tissue bleeding.  No history of abnormal bleeding in family members.  Patient has symptoms of fatigue, pallor,  DOE, decreased performance status.  Patient has pica to ice but not to starch/dirt.    To see Gynecology on September 24 2022.  Will arrange for IV iron and GI appointment  WBC 6.3 hemoglobin 7.4 MCV 57 platelet count 337; 54 segs 37 lymphs 8 monos 1 EO 1 basophil Hemoglobin electrophoresis normal adult pattern Factor VIII 266 von Willebrand factor antigen 180 ristocetin cofactor 96 fibrinogen 475 INR 1.0 PTT 31 Ferritin 3.1 folate 344 B12 218 CMP notable for glucose of 142  September 03, 2022:  Venofer 200 mg  October 01 2022:  Scheduled follow up for management of  anemia.  Reviewed results of labs with patient.  Feels better but still a bit winded.  Has not seen Gynecology because the visit was scheduled during her menses.  Waiting to hear when her new appointment will be.  Ice pica better but still there.   Arrange for the additional 4 doses of IV iron  Ferritin 2.  Hgb 8.0 MCV 63  October 09 2022  through Nov 12 2022:  Received 800 mg Venofer  January 28 2023:  Scheduled follow up for management of anemia   Review of Systems - Oncology  MEDICAL HISTORY: Past Medical History:  Diagnosis Date   Acid reflux    Anemia    Edema 07/12/2012   intermittent since 2014, diuretic use prior   Gestational diabetes 07/12/2000   Migraine    Strabismus    Ulcer    Wears glasses     SURGICAL HISTORY: Past Surgical History:  Procedure Laterality Date   CESAREAN SECTION     2002, 2005   CHOLECYSTECTOMY     KNEE SURGERY     arthroscopic   TONSILLECTOMY      SOCIAL HISTORY: Social History   Socioeconomic History   Marital status: Single    Spouse name: Not on file   Number of children: 2   Years of education: Not on file   Highest education level: Not on file  Occupational History   Occupation: customer service  Tobacco Use   Smoking status: Never    Passive exposure: Never  Smokeless tobacco: Never  Vaping Use   Vaping status: Never Used  Substance and Sexual Activity   Alcohol use: Yes    Comment: rarely   Drug use: No   Sexual activity: Not Currently    Partners: Male    Birth control/protection: Condom    Comment: menarche 47yo, sexual debut 47yo  Other Topics Concern   Not on file  Social History Narrative   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, mom is currently independent   Social Determinants of Health   Financial Resource Strain: Low Risk  (08/27/2022)   Overall Financial Resource Strain (CARDIA)    Difficulty of Paying Living Expenses: Not hard at all  Food Insecurity: No Food Insecurity  (08/27/2022)   Hunger Vital Sign    Worried About Running Out of Food in the Last Year: Never true    Ran Out of Food in the Last Year: Never true  Transportation Needs: No Transportation Needs (08/27/2022)   PRAPARE - Administrator, Civil Service (Medical): No    Lack of Transportation (Non-Medical): No  Physical Activity: Not on file  Stress: Not on file  Social Connections: Not on file  Intimate Partner Violence: Not At Risk (08/27/2022)   Humiliation, Afraid, Rape, and Kick questionnaire    Fear of Current or Ex-Partner: No    Emotionally Abused: No    Physically Abused: No    Sexually Abused: No    FAMILY HISTORY Family History  Problem Relation Age of Onset   Hypertension Mother    Diabetes Mother    Hypertension Father    Kidney failure Father    Hyperlipidemia Father    Hypertension Sister    Kidney failure Sister    Hypertension Brother    Hypertension Maternal Grandmother    Heart disease Neg Hx    Stomach cancer Neg Hx    Rectal cancer Neg Hx    Esophageal cancer Neg Hx    Colon cancer Neg Hx     ALLERGIES:  has No Known Allergies.  MEDICATIONS:  Current Outpatient Medications  Medication Sig Dispense Refill   famotidine (PEPCID) 40 MG/5ML suspension Take by mouth daily.     methocarbamol (ROBAXIN) 500 MG tablet Take 1 tablet (500 mg total) by mouth every 8 (eight) hours as needed for muscle spasms. 30 tablet 0   Norethindrone Acetate-Ethinyl Estrad-FE (JUNEL FE 24) 1-20 MG-MCG(24) tablet Take 1 tablet by mouth daily. 84 tablet 0   predniSONE (DELTASONE) 20 MG tablet Take 1 tablet (20 mg total) by mouth 2 (two) times daily with a meal. 10 tablet 0   No current facility-administered medications for this visit.    PHYSICAL EXAMINATION:  ECOG PERFORMANCE STATUS: 1 - Symptomatic but completely ambulatory   There were no vitals filed for this visit.   There were no vitals filed for this visit.    Physical Exam Vitals and nursing note  reviewed.  Constitutional:      General: She is not in acute distress.    Appearance: Normal appearance. She is obese. She is not ill-appearing, toxic-appearing or diaphoretic.  HENT:     Head: Normocephalic and atraumatic.     Right Ear: External ear normal.     Left Ear: External ear normal.     Nose: Nose normal. No congestion or rhinorrhea.  Eyes:     General: No scleral icterus.    Extraocular Movements: Extraocular movements intact.  Conjunctiva/sclera: Conjunctivae normal.     Pupils: Pupils are equal, round, and reactive to light.  Cardiovascular:     Rate and Rhythm: Normal rate.     Heart sounds: No murmur heard.    No friction rub. No gallop.  Pulmonary:     Effort: Pulmonary effort is normal. No respiratory distress.     Breath sounds: Normal breath sounds. No wheezing.  Abdominal:     General: Bowel sounds are normal.     Palpations: Abdomen is soft.     Tenderness: There is no abdominal tenderness. There is no guarding or rebound.  Musculoskeletal:        General: No swelling, tenderness or deformity.     Cervical back: Normal range of motion and neck supple. No rigidity or tenderness.  Lymphadenopathy:     Head:     Right side of head: No submental, submandibular, tonsillar, preauricular, posterior auricular or occipital adenopathy.     Left side of head: No submental, submandibular, tonsillar, preauricular, posterior auricular or occipital adenopathy.     Cervical: No cervical adenopathy.     Right cervical: No superficial, deep or posterior cervical adenopathy.    Left cervical: No superficial, deep or posterior cervical adenopathy.     Upper Body:     Right upper body: No supraclavicular, axillary, pectoral or epitrochlear adenopathy.     Left upper body: No supraclavicular, axillary, pectoral or epitrochlear adenopathy.  Skin:    General: Skin is warm.     Coloration: Skin is not jaundiced.     Findings: No erythema or rash.  Neurological:     General:  No focal deficit present.     Mental Status: She is alert and oriented to person, place, and time.     Cranial Nerves: No cranial nerve deficit.     Motor: No weakness.  Psychiatric:        Mood and Affect: Mood normal.        Behavior: Behavior normal.        Thought Content: Thought content normal.        Judgment: Judgment normal.      LABORATORY DATA: I have personally reviewed the data as listed:  Office Visit on 01/21/2023  Component Date Value Ref Range Status   High risk HPV 01/21/2023 Negative   Final   Neisseria Gonorrhea 01/21/2023 Negative   Final   Chlamydia 01/21/2023 Negative   Final   Trichomonas 01/21/2023 Negative   Final   Adequacy 01/21/2023 Satisfactory for evaluation.   Final   Diagnosis 01/21/2023 - Negative for intraepithelial lesion or malignancy (NILM)   Final   Comment 01/21/2023 Normal Reference Range Trichomonas - Negative   Final   Comment 01/21/2023 Normal Reference Range HPV - Negative   Final   Comment 01/21/2023 Normal Reference Ranger Chlamydia - Negative   Final   Comment 01/21/2023 Normal Reference Range Neisseria Gonorrhea - Negative   Final    RADIOGRAPHIC STUDIES: I have personally reviewed the radiological images as listed and agree with the findings in the report  No results found.  ASSESSMENT/PLAN  47 y.o. female is here because of anemia  Medical history notable for gestational diabetes, migraine, strabismus, low back pain, morbid obesity  Anemia:  Due to iron deficiency anemia owing to dysfunctional uterine bleeding/ exacerbated by high demand from prior pregnancies.   September 03, 2022:  Venofer 200 mg   October 01 2022- Ferritin 2.  Hgb 8.0 MCV 63.  Will arrange for  4 additional doses of Venofer  Dysfunctional uterine bleeding:  This is characterized by menses that are prolonged, irregular as well as by heavy bleeding with passage of clots.    August 27 2022- No evidence of bleeding disorder.  PLT count normal,.  PT, PTT,  Fibrinogen, von Willebrand screen normal October 01 2022- To see gynecology for evaluation and management    Cancer Staging  No matching staging information was found for the patient.    No problem-specific Assessment & Plan notes found for this encounter.   No orders of the defined types were placed in this encounter.  30  minutes was spent in patient care.  This included time spent preparing to see the patient (e.g., review of tests), obtaining and/or reviewing separately obtained history, counseling and educating the patient, ordering medications, tests, or procedures; documenting clinical information in the electronic or other health record, independently interpreting results and communicating results to the patient as well as coordination of care.      All questions were answered. The patient knows to call the clinic with any problems, questions or concerns.  This note was electronically signed.    Loni Muse, MD  01/27/2023 2:31 PM

## 2023-01-27 NOTE — Progress Notes (Signed)
Sherri Lindsey D.[facility] [address] [place] [address] Phone: [phone]   Assessment and Plan:     1. Chronic right-sided low back pain with right-sided sciatica 2. Right sided sciatica -Chronic with exacerbation, subsequent sports medicine visit - recurrent Right-sided low back pain with radicular symptoms flaring for over 1 year, with consistent pain greater than 1 month that is consistent with right-sided sciatica.    - Meloxicam was significantly beneficial in 08/2022 for treating back pain, so  reStart meloxicam 15 mg daily x2 weeks.  If still having pain after 2 weeks, complete 3rd-week of meloxicam. May use remaining meloxicam as needed once daily for pain control.  Do not to use additional NSAIDs while taking meloxicam.  May use Tylenol [phone] mg 2 to 3 times a day for breakthrough pain. - Patient elected for initial OMT today.  Tolerated well per note below. - Decision today to treat with OMT was based on Physical Exam  After verbal consent patient was treated with HVLA (high velocity low amplitude), ME (muscle energy), FPR (flex positional release), ST (soft tissue), PC/PD (Pelvic Compression/ Pelvic Decompression) techniques in sacral, lumbar, and pelvic areas. Patient tolerated the procedure well with improvement in symptoms.  Patient educated on potential side effects of soreness and recommended to rest, hydrate, and use Tylenol as needed for pain control.    Pertinent previous records reviewed include none   Follow Up: 4 weeks for reevaluation. Could consider MRI vs repeat OMT     Subjective:   I, Sherri Lindsey, am serving as a Neurosurgeon for Doctor Richardean Sale   Chief Complaint: low back pain    HPI:  08/26/2022 Patient is a 47 year old female complaining of low back pain. Patient states that she is having pain down her leg and glute, has pain sleeping, she works from home has three pillows on her chair and still has  pain, heat and ice dont help, she not standing level feels like she is leaning to the side, pain for about a month , has had something like this before, advil for the pain and that does not help , some numbness but no tingling, states her side goes limp she feels like she has no control over her right side    01/28/2023 Patient states that her back pain is flared she is in pain antalgic gait    Relevant Historical Information: None pertinent  Additional pertinent review of systems negative.   Current Outpatient Medications:    famotidine (PEPCID) 40 MG/5ML suspension, Take by mouth daily., Disp: , Rfl:    meloxicam (MOBIC) 15 MG tablet, Take 1 tablet (15 mg total) by mouth daily., Disp: 30 tablet, Rfl: 0   methocarbamol (ROBAXIN) 500 MG tablet, Take 1 tablet (500 mg total) by mouth every 8 (eight) hours as needed for muscle spasms., Disp: 30 tablet, Rfl: 0   Norethindrone Acetate-Ethinyl Estrad-FE (JUNEL FE 24) 1-20 MG-MCG(24) tablet, Take 1 tablet by mouth daily., Disp: 84 tablet, Rfl: 0   predniSONE (DELTASONE) 20 MG tablet, Take 1 tablet (20 mg total) by mouth 2 (two) times daily with a meal., Disp: 10 tablet, Rfl: 0   Objective:     Vitals:   01/28/23 1446  Pulse: 80  SpO2: 100%  Weight: 285 lb (129.3 kg)  Height: 5\' 3"  (1.6 m)      Body mass index is 50.49 kg/m.    Physical Exam:     Gen: Appears well, nad,  nontoxic and pleasant Psych: Alert and oriented, appropriate mood and affect Neuro: sensation intact, strength is 5/5 in upper and lower extremities, muscle tone wnl Skin: no susupicious lesions or rashes   Back - Normal skin, Spine with normal alignment and no deformity.     TTP right gluteal musculature, right lumbar paraspinal, lumbar vertebral process NTTP left gluteal musculature Straight leg raise positive right Trendelenberg positive right Piriformis Test positive right Negative logroll Negative FABER FADIR reproduced lateral hip tension     OMT  Physical Exam:  ASIS Compression Test: Positive Right Sacrum: + sphinx. Bilaterally TTP sacral base Lumbar: TTP paraspinal,L2 RLSL Pelvis: Right anterior innominate    Electronically signed by:  Sherri Lindsey D.[facility] 3:29 PM 01/28/23

## 2023-01-28 ENCOUNTER — Ambulatory Visit (INDEPENDENT_AMBULATORY_CARE_PROVIDER_SITE_OTHER): Payer: No Typology Code available for payment source | Admitting: Sports Medicine

## 2023-01-28 ENCOUNTER — Inpatient Hospital Stay: Payer: No Typology Code available for payment source | Attending: Oncology

## 2023-01-28 ENCOUNTER — Inpatient Hospital Stay: Payer: No Typology Code available for payment source | Admitting: Oncology

## 2023-01-28 VITALS — HR 80 | Ht 63.0 in | Wt 285.0 lb

## 2023-01-28 DIAGNOSIS — M5136 Other intervertebral disc degeneration, lumbar region: Secondary | ICD-10-CM

## 2023-01-28 DIAGNOSIS — M5431 Sciatica, right side: Secondary | ICD-10-CM | POA: Diagnosis not present

## 2023-01-28 DIAGNOSIS — G8929 Other chronic pain: Secondary | ICD-10-CM

## 2023-01-28 MED ORDER — MELOXICAM 15 MG PO TABS: 15.0000 mg | ORAL_TABLET | Freq: Every day | ORAL | 0 refills | Status: DC

## 2023-01-31 ENCOUNTER — Telehealth: Payer: Self-pay | Admitting: Oncology

## 2023-01-31 NOTE — Telephone Encounter (Signed)
 Left patient a vm regarding upcoming appointment

## 2023-02-17 ENCOUNTER — Other Ambulatory Visit: Payer: Self-pay | Admitting: Oncology

## 2023-02-17 DIAGNOSIS — D539 Nutritional anemia, unspecified: Secondary | ICD-10-CM

## 2023-02-17 NOTE — Progress Notes (Deleted)
[facility] Initial Visit:  Patient Care Team: Jarold Motto, [place] as PCP - General (Physician Assistant)  CHIEF COMPLAINTS/PURPOSE OF CONSULTATION:  HISTORY OF PRESENTING ILLNESS: Sherri Lindsey 47 y.o. female is here because of anemia Medical history notable for gestational diabetes, migraine, strabismus, low back pain, morbid obesity  September 15, 2020: Pelvic ultrasound notable for fibroid uterus  August 25, 2022: WBC 6.3 hemoglobin 7.4 MCV 57 platelet count 37; 54 segs 37 lymphs 8 monos 1 EO 1 basophil Ferritin 3.1 H. pylori IgG negative  August 27, 2022: [facility] Hematology Consult  Patient is G2 P2 Menopause not reached.  Menses occur monthly and last 6 to 7 days and are regular.  Bleeding is heavy.  Does not have bleeding between periods.    Does no pass clots.  Last pregnancy was delivered by C section in 2008.  Patient has uterine fibroids.  Does not take oral iron regularly.  Has received IV iron in the past without complication.  Has not required PRBC's in the past.  Has constipation with oral iron.  Has a normal diet.  No history of postpartum hemorrhage or hemorrhage after surgery.  Occasional hematochezia.  No melena, hemoptysis, hematuria.  No history of intra-articular or soft tissue bleeding.  No history of abnormal bleeding in family members.  Patient has symptoms of fatigue, pallor,  DOE, decreased performance status.  Patient has pica to ice but not to starch/dirt.    To see Gynecology on September 24 2022.  Will arrange for IV iron and GI appointment  WBC 6.3 hemoglobin 7.4 MCV 57 platelet count 337; 54 segs 37 lymphs 8 monos 1 EO 1 basophil Hemoglobin electrophoresis normal adult pattern Factor VIII 266 von Willebrand factor antigen 180 ristocetin cofactor 96 fibrinogen 475 INR 1.0 PTT 31 Ferritin 3.1 folate 344 B12 218 CMP notable for glucose of 142  September 03, 2022:  Venofer 200 mg  October 01 2022:  Scheduled follow up for management of  anemia.  Reviewed results of labs with patient.  Feels better but still a bit winded.  Has not seen Gynecology because the visit was scheduled during her menses.  Waiting to hear when her new appointment will be.  Ice pica better but still there.   Arrange for the additional 4 doses of IV iron  Ferritin 2.  Hgb 8.0 MCV 63  October 09 2022  through Nov 12 2022:  Received 800 mg Venofer  January 28 2023:  Scheduled follow up for management of anemia   Review of Systems - Oncology  MEDICAL HISTORY: Past Medical History:  Diagnosis Date   Acid reflux    Anemia    Edema 07/12/2012   intermittent since 2014, diuretic use prior   Gestational diabetes 07/12/2000   Migraine    Strabismus    Ulcer    Wears glasses     SURGICAL HISTORY: Past Surgical History:  Procedure Laterality Date   CESAREAN SECTION     2002, 2005   CHOLECYSTECTOMY     KNEE SURGERY     arthroscopic   TONSILLECTOMY      SOCIAL HISTORY: Social History   Socioeconomic History   Marital status: Single    Spouse name: Not on file   Number of children: 2   Years of education: Not on file   Highest education level: Not on file  Occupational History   Occupation: customer service  Tobacco Use   Smoking status: Never    Passive exposure: Never  Smokeless tobacco: Never  Vaping Use   Vaping status: Never Used  Substance and Sexual Activity   Alcohol use: Yes    Comment: rarely   Drug use: No   Sexual activity: Not Currently    Partners: Male    Birth control/protection: Condom    Comment: menarche 47yo, sexual debut 47yo  Other Topics Concern   Not on file  Social History Narrative   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, mom is currently independent   Social Determinants of Health   Financial Resource Strain: Low Risk  (08/27/2022)   Overall Financial Resource Strain (CARDIA)    Difficulty of Paying Living Expenses: Not hard at all  Food Insecurity: No Food Insecurity  (08/27/2022)   Hunger Vital Sign    Worried About Running Out of Food in the Last Year: Never true    Ran Out of Food in the Last Year: Never true  Transportation Needs: No Transportation Needs (08/27/2022)   PRAPARE - Administrator, Civil Service (Medical): No    Lack of Transportation (Non-Medical): No  Physical Activity: Not on file  Stress: Not on file  Social Connections: Not on file  Intimate Partner Violence: Not At Risk (08/27/2022)   Humiliation, Afraid, Rape, and Kick questionnaire    Fear of Current or Ex-Partner: No    Emotionally Abused: No    Physically Abused: No    Sexually Abused: No    FAMILY HISTORY Family History  Problem Relation Age of Onset   Hypertension Mother    Diabetes Mother    Hypertension Father    Kidney failure Father    Hyperlipidemia Father    Hypertension Sister    Kidney failure Sister    Hypertension Brother    Hypertension Maternal Grandmother    Heart disease Neg Hx    Stomach cancer Neg Hx    Rectal cancer Neg Hx    Esophageal cancer Neg Hx    Colon cancer Neg Hx     ALLERGIES:  has No Known Allergies.  MEDICATIONS:  Current Outpatient Medications  Medication Sig Dispense Refill   famotidine (PEPCID) 40 MG/5ML suspension Take by mouth daily.     meloxicam (MOBIC) 15 MG tablet Take 1 tablet (15 mg total) by mouth daily. 30 tablet 0   methocarbamol (ROBAXIN) 500 MG tablet Take 1 tablet (500 mg total) by mouth every 8 (eight) hours as needed for muscle spasms. 30 tablet 0   Norethindrone Acetate-Ethinyl Estrad-FE (JUNEL FE 24) 1-20 MG-MCG(24) tablet Take 1 tablet by mouth daily. 84 tablet 0   predniSONE (DELTASONE) 20 MG tablet Take 1 tablet (20 mg total) by mouth 2 (two) times daily with a meal. 10 tablet 0   No current facility-administered medications for this visit.    PHYSICAL EXAMINATION:  ECOG PERFORMANCE STATUS: 1 - Symptomatic but completely ambulatory   There were no vitals filed for this  visit.   There were no vitals filed for this visit.    Physical Exam Vitals and nursing note reviewed.  Constitutional:      General: She is not in acute distress.    Appearance: Normal appearance. She is obese. She is not ill-appearing, toxic-appearing or diaphoretic.  HENT:     Head: Normocephalic and atraumatic.     Right Ear: External ear normal.     Left Ear: External ear normal.     Nose: Nose normal. No congestion or rhinorrhea.  Eyes:  General: No scleral icterus.    Extraocular Movements: Extraocular movements intact.     Conjunctiva/sclera: Conjunctivae normal.     Pupils: Pupils are equal, round, and reactive to light.  Cardiovascular:     Rate and Rhythm: Normal rate.     Heart sounds: No murmur heard.    No friction rub. No gallop.  Pulmonary:     Effort: Pulmonary effort is normal. No respiratory distress.     Breath sounds: Normal breath sounds. No wheezing.  Abdominal:     General: Bowel sounds are normal.     Palpations: Abdomen is soft.     Tenderness: There is no abdominal tenderness. There is no guarding or rebound.  Musculoskeletal:        General: No swelling, tenderness or deformity.     Cervical back: Normal range of motion and neck supple. No rigidity or tenderness.  Lymphadenopathy:     Head:     Right side of head: No submental, submandibular, tonsillar, preauricular, posterior auricular or occipital adenopathy.     Left side of head: No submental, submandibular, tonsillar, preauricular, posterior auricular or occipital adenopathy.     Cervical: No cervical adenopathy.     Right cervical: No superficial, deep or posterior cervical adenopathy.    Left cervical: No superficial, deep or posterior cervical adenopathy.     Upper Body:     Right upper body: No supraclavicular, axillary, pectoral or epitrochlear adenopathy.     Left upper body: No supraclavicular, axillary, pectoral or epitrochlear adenopathy.  Skin:    General: Skin is warm.      Coloration: Skin is not jaundiced.     Findings: No erythema or rash.  Neurological:     General: No focal deficit present.     Mental Status: She is alert and oriented to person, place, and time.     Cranial Nerves: No cranial nerve deficit.     Motor: No weakness.  Psychiatric:        Mood and Affect: Mood normal.        Behavior: Behavior normal.        Thought Content: Thought content normal.        Judgment: Judgment normal.      LABORATORY DATA: I have personally reviewed the data as listed:  Office Visit on 01/21/2023  Component Date Value Ref Range Status   High risk HPV 01/21/2023 Negative   Final   Neisseria Gonorrhea 01/21/2023 Negative   Final   Chlamydia 01/21/2023 Negative   Final   Trichomonas 01/21/2023 Negative   Final   Adequacy 01/21/2023 Satisfactory for evaluation.   Final   Diagnosis 01/21/2023 - Negative for intraepithelial lesion or malignancy (NILM)   Final   Comment 01/21/2023 Normal Reference Range Trichomonas - Negative   Final   Comment 01/21/2023 Normal Reference Range HPV - Negative   Final   Comment 01/21/2023 Normal Reference Ranger Chlamydia - Negative   Final   Comment 01/21/2023 Normal Reference Range Neisseria Gonorrhea - Negative   Final    RADIOGRAPHIC STUDIES: I have personally reviewed the radiological images as listed and agree with the findings in the report  No results found.  ASSESSMENT/PLAN  47 y.o. female is here because of anemia  Medical history notable for gestational diabetes, migraine, strabismus, low back pain, morbid obesity  Anemia:  Due to iron deficiency anemia owing to dysfunctional uterine bleeding/ exacerbated by high demand from prior pregnancies.   September 03, 2022:  Venofer 200 mg  October 01 2022- Ferritin 2.  Hgb 8.0 MCV 63.  Will arrange for 4 additional doses of Venofer  Dysfunctional uterine bleeding:  This is characterized by menses that are prolonged, irregular as well as by heavy bleeding with passage  of clots.    August 27 2022- No evidence of bleeding disorder.  PLT count normal,.  PT, PTT, Fibrinogen, von Willebrand screen normal October 01 2022- To see gynecology for evaluation and management    Cancer Staging  No matching staging information was found for the patient.    No problem-specific Assessment & Plan notes found for this encounter.   No orders of the defined types were placed in this encounter.  30  minutes was spent in patient care.  This included time spent preparing to see the patient (e.g., review of tests), obtaining and/or reviewing separately obtained history, counseling and educating the patient, ordering medications, tests, or procedures; documenting clinical information in the electronic or other health record, independently interpreting results and communicating results to the patient as well as coordination of care.      All questions were answered. The patient knows to call the clinic with any problems, questions or concerns.  This note was electronically signed.    Loni Muse, MD  02/17/2023 12:16 PM

## 2023-02-18 ENCOUNTER — Telehealth: Payer: Self-pay | Admitting: Oncology

## 2023-02-18 ENCOUNTER — Ambulatory Visit
Admission: EM | Admit: 2023-02-18 | Discharge: 2023-02-18 | Disposition: A | Discharge status: Home / Self Care | Payer: No Typology Code available for payment source | Attending: Internal Medicine | Admitting: Internal Medicine

## 2023-02-18 ENCOUNTER — Inpatient Hospital Stay: Payer: No Typology Code available for payment source | Attending: Oncology

## 2023-02-18 ENCOUNTER — Inpatient Hospital Stay: Payer: No Typology Code available for payment source | Admitting: Oncology

## 2023-02-18 DIAGNOSIS — M545 Low back pain, unspecified: Secondary | ICD-10-CM

## 2023-02-18 DIAGNOSIS — G8929 Other chronic pain: Secondary | ICD-10-CM | POA: Diagnosis not present

## 2023-02-18 MED ORDER — KETOROLAC TROMETHAMINE 30 MG/ML IJ SOLN
60.0000 mg | Freq: Once | INTRAMUSCULAR | Status: AC
  Administered 2023-02-18: 60 mg via INTRAMUSCULAR

## 2023-02-18 MED ORDER — CYCLOBENZAPRINE HCL 5 MG PO TABS: 5.0000 mg | ORAL_TABLET | Freq: Three times a day (TID) | ORAL | 0 refills | Status: DC | PRN

## 2023-02-18 MED ORDER — NAPROXEN 375 MG PO TABS
375.0000 mg | ORAL_TABLET | Freq: Two times a day (BID) | ORAL | 0 refills | Status: AC
Start: 2023-02-19 — End: 2023-02-26

## 2023-02-18 NOTE — Telephone Encounter (Signed)
Called to reschedule missed 08/09 appointments, left a voicemail.

## 2023-02-18 NOTE — ED Provider Notes (Signed)
[facility]    CSN: [id number] Arrival date & time: 02/18/23  1435      History   Chief Complaint No chief complaint on file.   HPI Sherri Lindsey is a 47 y.o. female presents for evaluation of back pain.  Patient has a history of chronic right-sided low back pain with degenerative disc disease.  She has been following with sports medicine for this last seen in July.  She reports 3 days ago she twisted wrong in her chair causing her right lower back to flare.  Does radiate slightly into her buttock.  Denies any numbness/tingling/weakness of her lower extremities, no bowel or bladder incontinence, no saddle paresthesia.  She has been taking Tylenol OTC.  She was previously on meloxicam but states she ran out of this over a week ago.  She has not taken any OTC NSAIDs in the past 48 hours.  She is also been on Robaxin in the past but states this was not beneficial to her.  No other concerns at this time.  HPI  Past Medical History:  Diagnosis Date   Acid reflux    Anemia    Edema 07/12/2012   intermittent since 2014, diuretic use prior   Gestational diabetes 07/12/2000   Migraine    Strabismus    Ulcer    Wears glasses     Patient Active Problem List   Diagnosis Date Noted   DDD (degenerative disc disease), lumbar 01/28/2023   Nausea 11/15/2022   Gastroesophageal reflux disease 11/15/2022   Iron deficiency anemia 11/15/2022   Deficiency anemia 08/27/2022   Dysfunctional uterine bleeding 08/27/2022   History of gestational diabetes 08/25/2022   Heartburn 08/25/2022   History of anemia 09/17/2016   Anxiety 09/17/2016    Past Surgical History:  Procedure Laterality Date   CESAREAN SECTION     2002, 2005   CHOLECYSTECTOMY     KNEE SURGERY     arthroscopic   TONSILLECTOMY      OB History     Gravida  3   Para  2   Term      Preterm      AB  1   Living  2      SAB      IAB      Ectopic      Multiple      Live Births                Home Medications    Prior to Admission medications   Medication Sig Start Date End Date Taking? Authorizing Provider  cyclobenzaprine (FLEXERIL) 5 MG tablet Take 1 tablet (5 mg total) by mouth 3 (three) times daily as needed for muscle spasms. 02/18/23  Yes Radford Pax, NP  naproxen (NAPROSYN) 375 MG tablet Take 1 tablet (375 mg total) by mouth 2 (two) times daily for 7 days. 02/19/23 02/26/23 Yes Radford Pax, NP  famotidine (PEPCID) 40 MG/5ML suspension Take by mouth daily.    [provider]  Norethindrone Acetate-Ethinyl Estrad-FE (JUNEL FE 24) 1-20 MG-MCG(24) tablet Take 1 tablet by mouth daily. 01/21/23   Chrzanowski, Lamona Curl, NP  predniSONE (DELTASONE) 20 MG tablet Take 1 tablet (20 mg total) by mouth 2 (two) times daily with a meal. 01/20/23   Jarold Motto, PA    Family History Family History  Problem Relation Age of Onset   Hypertension Mother    Diabetes Mother    Hypertension Father    Kidney failure Father  Hyperlipidemia Father    Hypertension Sister    Kidney failure Sister    Hypertension Brother    Hypertension Maternal Grandmother    Heart disease Neg Hx    Stomach cancer Neg Hx    Rectal cancer Neg Hx    Esophageal cancer Neg Hx    Colon cancer Neg Hx     Social History Social History   Tobacco Use   Smoking status: Never    Passive exposure: Never   Smokeless tobacco: Never  Vaping Use   Vaping status: Never Used  Substance Use Topics   Alcohol use: Yes    Comment: rarely   Drug use: No     Allergies   Patient has no known allergies.   Review of Systems Review of Systems  Musculoskeletal:  Positive for back pain.     Physical Exam Triage Vital Signs ED Triage Vitals  Encounter Vitals Group     BP 02/18/23 1547 (!) 168/97     Systolic BP Percentile --      Diastolic BP Percentile --      Pulse Rate 02/18/23 1547 85     Resp 02/18/23 1547 18     Temp 02/18/23 1547 98.1 F (36.7 C)     Temp Source 02/18/23 1547 Oral      SpO2 02/18/23 1547 99 %     Weight --      Height --      Head Circumference --      Peak Flow --      Pain Score 02/18/23 1554 7     Pain Loc --      Pain Education --      Exclude from Growth Chart --    No data found.  Updated Vital Signs BP (!) 168/97 (BP Location: Right Arm)   Pulse 85   Temp 98.1 F (36.7 C) (Oral)   Resp 18   LMP 02/07/2023 (Approximate)   SpO2 99%   Visual Acuity Right Eye Distance:   Left Eye Distance:   Bilateral Distance:    Right Eye Near:   Left Eye Near:    Bilateral Near:     Physical Exam Vitals and nursing note reviewed.  Constitutional:      General: She is not in acute distress.    Appearance: Normal appearance. She is obese. She is not ill-appearing, toxic-appearing or diaphoretic.  HENT:     Head: Normocephalic and atraumatic.  Eyes:     Pupils: Pupils are equal, round, and reactive to light.  Cardiovascular:     Rate and Rhythm: Normal rate.  Pulmonary:     Effort: Pulmonary effort is normal.  Musculoskeletal:     Lumbar back: Spasms and tenderness present. No swelling, edema, deformity, signs of trauma, lacerations or bony tenderness. Negative right straight leg raise test and negative left straight leg raise test.       Back:  Skin:    General: Skin is warm and dry.  Neurological:     General: No focal deficit present.     Mental Status: She is alert and oriented to person, place, and time.  Psychiatric:        Mood and Affect: Mood normal.        Behavior: Behavior normal.      UC Treatments / Results  Labs (all labs ordered are listed, but only abnormal results are displayed) Labs Reviewed - No data to display  EKG   Radiology No results found.  Procedures Procedures (including critical care time)  Medications Ordered in UC Medications  ketorolac (TORADOL) 30 MG/ML injection 60 mg (has no administration in time range)    Initial Impression / Assessment and Plan / UC Course  I have reviewed  the triage vital signs and the nursing notes.  Pertinent labs & imaging results that were available during my care of the patient were reviewed by me and considered in my medical decision making (see chart for details).     Reviewed exam and symptoms with patient.  No red flags.  Patient given Toradol injection in clinic.  She was monitored for 10 minutes after injection with no reaction noted and tolerated well.  Will start naproxen twice daily tomorrow.  Trial of Flexeril as needed.  Side effect profile reviewed.  Heat and rest.  PCP or sports medicine follow-up if symptoms do not improve.  ER precautions reviewed and patient verbalized understanding. Final Clinical Impressions(s) / UC Diagnoses   Final diagnoses:  Chronic right-sided low back pain, unspecified whether sciatica present     Discharge Instructions      You were given a Toradol injection in clinic today. Do not take any over the counter NSAID's such as Advil, ibuprofen, Aleve, or naproxen for 24 hours.  You may take tylenol if needed Trial of Flexeril as needed.  Please have this medication will make you drowsy.  Do not drink alcohol or drive while on this medication You may start naproxen twice daily for 7 days tomorrow, 8/10 Heat and rest to the back Please follow-up with your PCP or sports medicine provider if symptoms are not improving Please go to the ER for any worsening symptoms.  Hope you feel better soon!     ED Prescriptions     Medication Sig Dispense Auth. Provider   naproxen (NAPROSYN) 375 MG tablet Take 1 tablet (375 mg total) by mouth 2 (two) times daily for 7 days. 14 tablet Radford Pax, NP   cyclobenzaprine (FLEXERIL) 5 MG tablet Take 1 tablet (5 mg total) by mouth 3 (three) times daily as needed for muscle spasms. 15 tablet Radford Pax, NP      PDMP not reviewed this encounter.   Radford Pax, NP 02/18/23 [phone]

## 2023-02-18 NOTE — Discharge Instructions (Addendum)
You were given a Toradol injection in clinic today. Do not take any over the counter NSAID's such as Advil, ibuprofen, Aleve, or naproxen for 24 hours.  You may take tylenol if needed Trial of Flexeril as needed.  Please have this medication will make you drowsy.  Do not drink alcohol or drive while on this medication You may start naproxen twice daily for 7 days tomorrow, 8/10 Heat and rest to the back Please follow-up with your PCP or sports medicine provider if symptoms are not improving Please go to the ER for any worsening symptoms.  Hope you feel better soon!

## 2023-02-18 NOTE — ED Triage Notes (Signed)
Pt presents to UC w/ c/o right lower back pain that radiates down the right leg. Pt states she was seen by a sports doctor 4 weeks ago and he prescribed meloxicam and prednisone. Pt states the pain has not improved. Tylenol does not help.

## 2023-02-24 ENCOUNTER — Other Ambulatory Visit: Payer: Self-pay | Admitting: Sports Medicine

## 2023-02-24 NOTE — Progress Notes (Signed)
Sherri Lindsey D.[facility] [address] [place] [address] Phone: [phone]   Assessment and Plan:     1. Chronic right-sided low back pain with right-sided sciatica 2. Right sided sciatica 3. DDD (degenerative disc disease), lumbar -Chronic with exacerbation, subsequent visit - Continued back pain with right-sided radicular symptoms.  Patient did not experience relief with meloxicam course, HEP, relative rest - Urgent care provided patient with muscle relaxers which make her tired, but are not providing long-term relief.  May continue muscle relaxers as needed - Start gabapentin 100 to 100 mg nightly for radicular symptoms - Due to failure to improve with >6 weeks of conservative therapy, degenerative changes seen on lumbar x-ray, pain frequently >6/10, and pain affecting day-to-day activities, I recommend further evaluation with lumbar MRI.  Ordered today   Pertinent previous records reviewed include ER note 02/18/2023   Follow Up: 4 days after MRI to review results and discuss treatment plan.  Could consider epidural CSI if appropriate based on MRI   Subjective:   I, Sherri Lindsey, am serving as a Neurosurgeon for Doctor Richardean Sale   Chief Complaint: low back pain    HPI:  08/26/2022 Patient is a 47 year old female complaining of low back pain. Patient states that she is having pain down her leg and glute, has pain sleeping, she works from home has three pillows on her chair and still has pain, heat and ice dont help, she not standing level feels like she is leaning to the side, pain for about a month , has had something like this before, advil for the pain and that does not help , some numbness but no tingling, states her side goes limp she feels like she has no control over her right side    01/28/2023 Patient states that her back pain is flared she is in pain antalgic gait   02/25/2023 Patient states was in really bad flare last week was given a  muscle relaxer    Relevant Historical Information: None pertinent  Additional pertinent review of systems negative.  Current Outpatient Medications  Medication Sig Dispense Refill   gabapentin (NEURONTIN) 100 MG capsule Take 1 capsule (100 mg total) by mouth at bedtime as needed. 30 capsule 0   cyclobenzaprine (FLEXERIL) 5 MG tablet Take 1 tablet (5 mg total) by mouth 3 (three) times daily as needed for muscle spasms. 15 tablet 0   famotidine (PEPCID) 40 MG/5ML suspension Take by mouth daily.     naproxen (NAPROSYN) 375 MG tablet Take 1 tablet (375 mg total) by mouth 2 (two) times daily for 7 days. 14 tablet 0   Norethindrone Acetate-Ethinyl Estrad-FE (JUNEL FE 24) 1-20 MG-MCG(24) tablet Take 1 tablet by mouth daily. 84 tablet 0   predniSONE (DELTASONE) 20 MG tablet Take 1 tablet (20 mg total) by mouth 2 (two) times daily with a meal. 10 tablet 0   No current facility-administered medications for this visit.      Objective:     Vitals:   02/25/23 1505  BP: 132/84  Pulse: (!) 110  SpO2: 99%  Weight: 285 lb (129.3 kg)  Height: 5\' 3"  (1.6 m)      Body mass index is 50.49 kg/m.    Physical Exam:      Gen: Appears well, nad, nontoxic and pleasant Psych: Alert and oriented, appropriate mood and affect Neuro: sensation intact, strength is 5/5 in upper and lower extremities, muscle tone wnl Skin: no susupicious lesions  or rashes   Back - Normal skin, Spine with normal alignment and no deformity.     TTP right gluteal musculature, right lumbar paraspinal, lumbar vertebral process NTTP left gluteal musculature Straight leg raise positive right Trendelenberg positive right Piriformis Test positive right Negative logroll Negative FABER FADIR reproduced lateral hip tension   Electronically signed by:  Sherri Lindsey D.[facility] 3:18 PM 02/25/23

## 2023-02-25 ENCOUNTER — Ambulatory Visit (INDEPENDENT_AMBULATORY_CARE_PROVIDER_SITE_OTHER): Payer: No Typology Code available for payment source | Admitting: Sports Medicine

## 2023-02-25 VITALS — BP 132/84 | HR 110 | Ht 63.0 in | Wt 285.0 lb

## 2023-02-25 DIAGNOSIS — M5136 Other intervertebral disc degeneration, lumbar region: Secondary | ICD-10-CM | POA: Diagnosis not present

## 2023-02-25 DIAGNOSIS — G8929 Other chronic pain: Secondary | ICD-10-CM

## 2023-02-25 DIAGNOSIS — M5441 Lumbago with sciatica, right side: Secondary | ICD-10-CM

## 2023-02-25 DIAGNOSIS — M5431 Sciatica, right side: Secondary | ICD-10-CM

## 2023-02-25 MED ORDER — GABAPENTIN 100 MG PO CAPS: 100.0000 mg | ORAL_CAPSULE | Freq: Every evening | ORAL | 0 refills | Status: DC | PRN

## 2023-02-25 NOTE — Patient Instructions (Signed)
Mri referral  Start gabapentin 100-200 mg nightly Follow up 4 days after MRI

## 2023-03-03 NOTE — Addendum Note (Signed)
Addended by: Edwena Felty T on: 03/03/2023 11:21 AM   Modules accepted: Orders

## 2023-03-29 ENCOUNTER — Encounter: Payer: Self-pay | Admitting: Sports Medicine

## 2023-03-29 ENCOUNTER — Encounter: Payer: Self-pay | Admitting: Oncology

## 2023-03-31 ENCOUNTER — Ambulatory Visit
Admission: RE | Admit: 2023-03-31 | Discharge: 2023-03-31 | Disposition: A | Discharge status: Home / Self Care | Payer: No Typology Code available for payment source | Source: Ambulatory Visit | Attending: Sports Medicine | Admitting: Sports Medicine

## 2023-03-31 DIAGNOSIS — M5431 Sciatica, right side: Secondary | ICD-10-CM

## 2023-03-31 DIAGNOSIS — M5136 Other intervertebral disc degeneration, lumbar region: Secondary | ICD-10-CM

## 2023-03-31 DIAGNOSIS — G8929 Other chronic pain: Secondary | ICD-10-CM

## 2023-04-06 NOTE — Progress Notes (Unsigned)
    Aleen Sells [facility] [address] [place] [address] Phone: [phone]   Assessment and Plan:     There are no diagnoses linked to this encounter.  ***   Pertinent previous records reviewed include ***   Follow Up: ***     Subjective:   I, Brennah Quraishi, am serving as a Neurosurgeon for Doctor Richardean Sale   Chief Complaint: low back pain    HPI:  08/26/2022 Patient is a 47 year old female complaining of low back pain. Patient states that she is having pain down her leg and glute, has pain sleeping, she works from home has three pillows on her chair and still has pain, heat and ice dont help, she not standing level feels like she is leaning to the side, pain for about a month , has had something like this before, advil for the pain and that does not help , some numbness but no tingling, states her side goes limp she feels like she has no control over her right side    01/28/2023 Patient states that her back pain is flared she is in pain antalgic gait   02/25/2023 Patient states was in really bad flare last week was given a muscle relaxer   04/07/2023 Patient states   Relevant Historical Information: None pertinent  Additional pertinent review of systems negative.   Current Outpatient Medications:    cyclobenzaprine (FLEXERIL) 5 MG tablet, Take 1 tablet (5 mg total) by mouth 3 (three) times daily as needed for muscle spasms., Disp: 15 tablet, Rfl: 0   famotidine (PEPCID) 40 MG/5ML suspension, Take by mouth daily., Disp: , Rfl:    gabapentin (NEURONTIN) 100 MG capsule, Take 1 capsule (100 mg total) by mouth at bedtime as needed., Disp: 30 capsule, Rfl: 0   Norethindrone Acetate-Ethinyl Estrad-FE (JUNEL FE 24) 1-20 MG-MCG(24) tablet, Take 1 tablet by mouth daily., Disp: 84 tablet, Rfl: 0   predniSONE (DELTASONE) 20 MG tablet, Take 1 tablet (20 mg total) by mouth 2 (two) times daily with a meal., Disp: 10 tablet, Rfl: 0    Objective:     There were no vitals filed for this visit.    There is no height or weight on file to calculate BMI.    Physical Exam:    ***   Electronically signed by:  Aleen Sells [facility] 7:48 AM 04/06/23

## 2023-04-07 ENCOUNTER — Ambulatory Visit: Payer: No Typology Code available for payment source | Admitting: Sports Medicine

## 2023-04-07 VITALS — BP 132/84 | HR 100 | Ht 63.0 in | Wt 277.0 lb

## 2023-04-07 DIAGNOSIS — M5136 Other intervertebral disc degeneration, lumbar region: Secondary | ICD-10-CM | POA: Diagnosis not present

## 2023-04-07 DIAGNOSIS — M5126 Other intervertebral disc displacement, lumbar region: Secondary | ICD-10-CM

## 2023-04-07 DIAGNOSIS — G8929 Other chronic pain: Secondary | ICD-10-CM | POA: Diagnosis not present

## 2023-04-07 DIAGNOSIS — M5441 Lumbago with sciatica, right side: Secondary | ICD-10-CM

## 2023-04-07 DIAGNOSIS — M5431 Sciatica, right side: Secondary | ICD-10-CM

## 2023-04-07 NOTE — Patient Instructions (Signed)
Epidural L2-3 right  Follow up 2 weeks after

## 2023-04-11 ENCOUNTER — Ambulatory Visit: Payer: No Typology Code available for payment source | Admitting: Physician Assistant

## 2023-04-12 NOTE — Discharge Instructions (Signed)

## 2023-04-12 NOTE — Progress Notes (Shared)
Sherri Lindsey is a 47 y.o. female here for a follow up of a pre-existing problem.  History of Present Illness:   No chief complaint on file.   HPI  Lower Back Pain She has been having lower back pain for 5 months that radiates to the right side. 04/07/23 MRI lumbar showed: 1) Age accelerated multilevel disc and facet degeneration throughout the lumbar spine, 2) Severe spinal canal stenosis at L2-L3 due to a prominent central disc extrusion and facet arthropathy, 3) Left subarticular zone narrowing at L3-L4 and L4-L5 with moderate left neural foraminal stenosis at L4-L5, and moderate to severe left and moderate right neural foraminal stenosis at L5-S1, 4) Diffusely abnormal T1 marrow signal throughout the lumbar spine is nonspecific and can be seen in the setting of smoking, anemia, obesity, or infiltrative marrow process including metastatic disease. Recommend correlation with history and CBC, 5) Incompletely imaged fibroid uterus. Taking gabapentin 100 mg at bedtime as needed, cyclobenzaprine 5 mg three times daily as needed, prednisone 20 mg twice daily, famotidine 40 mg daily.   Past Medical History:  Diagnosis Date   Acid reflux    Anemia    Edema 07/12/2012   intermittent since 2014, diuretic use prior   Gestational diabetes 07/12/2000   Migraine    Strabismus    Ulcer    Wears glasses      Social History   Tobacco Use   Smoking status: Never    Passive exposure: Never   Smokeless tobacco: Never  Vaping Use   Vaping status: Never Used  Substance Use Topics   Alcohol use: Yes    Comment: rarely   Drug use: No    Past Surgical History:  Procedure Laterality Date   CESAREAN SECTION     2002, 2005   CHOLECYSTECTOMY     KNEE SURGERY     arthroscopic   TONSILLECTOMY      Family History  Problem Relation Age of Onset   Hypertension Mother    Diabetes Mother    Hypertension Father    Kidney failure Father    Hyperlipidemia Father    Hypertension Sister     Kidney failure Sister    Hypertension Brother    Hypertension Maternal Grandmother    Heart disease Neg Hx    Stomach cancer Neg Hx    Rectal cancer Neg Hx    Esophageal cancer Neg Hx    Colon cancer Neg Hx     No Known Allergies  Current Medications:   Current Outpatient Medications:    cyclobenzaprine (FLEXERIL) 5 MG tablet, Take 1 tablet (5 mg total) by mouth 3 (three) times daily as needed for muscle spasms., Disp: 15 tablet, Rfl: 0   famotidine (PEPCID) 40 MG/5ML suspension, Take by mouth daily., Disp: , Rfl:    gabapentin (NEURONTIN) 100 MG capsule, Take 1 capsule (100 mg total) by mouth at bedtime as needed., Disp: 30 capsule, Rfl: 0   Norethindrone Acetate-Ethinyl Estrad-FE (JUNEL FE 24) 1-20 MG-MCG(24) tablet, Take 1 tablet by mouth daily., Disp: 84 tablet, Rfl: 0   predniSONE (DELTASONE) 20 MG tablet, Take 1 tablet (20 mg total) by mouth 2 (two) times daily with a meal., Disp: 10 tablet, Rfl: 0   Review of Systems:   ROS  Vitals:   There were no vitals filed for this visit.   There is no height or weight on file to calculate BMI.  Physical Exam:   Physical Exam  Assessment and Plan:   ***  I,Alexander Ruley,acting as a Neurosurgeon for [employer], PA.,have documented all relevant documentation on the behalf of Jarold Motto, PA,as directed by  Jarold Motto, PA while in the presence of Jarold Motto, [place].   ***   Jarold Motto, PA-C

## 2023-04-13 ENCOUNTER — Inpatient Hospital Stay
Admission: RE | Admit: 2023-04-13 | Discharge: 2023-04-13 | Discharge status: Home / Self Care | Payer: No Typology Code available for payment source | Source: Ambulatory Visit | Attending: Sports Medicine

## 2023-04-13 ENCOUNTER — Telehealth: Payer: Self-pay | Admitting: Physician Assistant

## 2023-04-13 ENCOUNTER — Ambulatory Visit: Payer: No Typology Code available for payment source | Admitting: Physician Assistant

## 2023-04-13 ENCOUNTER — Other Ambulatory Visit: Payer: Self-pay | Admitting: Radiology

## 2023-04-13 DIAGNOSIS — G8929 Other chronic pain: Secondary | ICD-10-CM

## 2023-04-13 DIAGNOSIS — M5431 Sciatica, right side: Secondary | ICD-10-CM

## 2023-04-13 DIAGNOSIS — M51369 Other intervertebral disc degeneration, lumbar region without mention of lumbar back pain or lower extremity pain: Secondary | ICD-10-CM

## 2023-04-13 DIAGNOSIS — N920 Excessive and frequent menstruation with regular cycle: Secondary | ICD-10-CM

## 2023-04-13 DIAGNOSIS — Z30011 Encounter for initial prescription of contraceptive pills: Secondary | ICD-10-CM

## 2023-04-13 MED ORDER — METHYLPREDNISOLONE ACETATE 40 MG/ML INJ SUSP (RADIOLOG
80.0000 mg | Freq: Once | INTRAMUSCULAR | Status: AC
  Administered 2023-04-13: 80 mg via EPIDURAL

## 2023-04-13 MED ORDER — IOPAMIDOL (ISOVUE-M 200) INJECTION 41%
1.0000 mL | Freq: Once | INTRAMUSCULAR | Status: AC
  Administered 2023-04-13: 1 mL via EPIDURAL

## 2023-04-13 NOTE — Telephone Encounter (Signed)
Medication refill request: Hailey FE  Last AEX:  01/21/23 Next AEX:  not scheduled  Last MMG (if hormonal medication request): none on file  Refill authorized: #[id number] pended for today. Note sent to pharmacy for patient to schedule follow up with JC

## 2023-04-13 NOTE — Telephone Encounter (Signed)
LVM informing pt of no show status from today's visit. I offered pt to call back for reschedule.

## 2023-04-26 ENCOUNTER — Other Ambulatory Visit: Payer: Self-pay | Admitting: Radiology

## 2023-04-26 DIAGNOSIS — N920 Excessive and frequent menstruation with regular cycle: Secondary | ICD-10-CM

## 2023-04-26 DIAGNOSIS — Z30011 Encounter for initial prescription of contraceptive pills: Secondary | ICD-10-CM

## 2023-04-26 NOTE — Telephone Encounter (Signed)
Recall placed, needs follow up.  RF was sent on 04/13/23 for #28 Sherri Lindsey 24 FE 1/20 (noted on that refill that she needed appointment).

## 2023-04-26 NOTE — Telephone Encounter (Signed)
Ok to refill x 28days and schedule appt

## 2023-04-26 NOTE — Telephone Encounter (Signed)
Med refill request: Sherri Lindsey #[id number] Last AEX: 01/21/23 (needed 3 month follow up) Next AEX: not scheduled Last MMG (if hormonal med) n/a Refill is a duplicate request.  Denied, sent to provider for review.

## 2023-04-26 NOTE — Progress Notes (Deleted)
    Aleen [facility]ells [facility] [address] [place] [address] Phone: [phone]   Assessment and Plan:     There are no diagnoses linked to this encounter.  ***   Pertinent previous records reviewed include ***   Follow Up: ***     Subjective:   I, Alaja Goldinger, am serving as a Neurosurgeon for Doctor Richardean Sale   Chief Complaint: low back pain    HPI:  08/26/2022 Patient is a 47 year old female complaining of low back pain. Patient states that she is having pain down her leg and glute, has pain sleeping, she works from home has three pillows on her chair and still has pain, heat and ice dont help, she not standing level feels like she is leaning to the side, pain for about a month , has had something like this before, advil for the pain and that does not help , some numbness but no tingling, states her side goes limp she feels like she has no control over her right side    01/28/2023 Patient states that her back pain is flared she is in pain antalgic gait   02/25/2023 Patient states was in really bad flare last week was given a muscle relaxer    04/07/2023 Patient states that she is feeling horrible. She has pain even going to the bathroom   05/09/2023  Patient states  Relevant Historical Information: None pertinent  Additional pertinent review of systems negative.   Current Outpatient Medications:    cyclobenzaprine (FLEXERIL) 5 MG tablet, Take 1 tablet (5 mg total) by mouth 3 (three) times daily as needed for muscle spasms., Disp: 15 tablet, Rfl: 0   famotidine (PEPCID) 40 MG/5ML suspension, Take by mouth daily., Disp: , Rfl:    gabapentin (NEURONTIN) 100 MG capsule, Take 1 capsule (100 mg total) by mouth at bedtime as needed., Disp: 30 capsule, Rfl: 0   HAILEY 24 FE 1-20 MG-MCG(24) tablet, TAKE 1 TABLET BY MOUTH EVERY DAY, Disp: 28 tablet, Rfl: 0   predniSONE (DELTASONE) 20 MG tablet, Take 1 tablet (20 mg total) by mouth 2  (two) times daily with a meal., Disp: 10 tablet, Rfl: 0   Objective:     There were no vitals filed for this visit.    There is no height or weight on file to calculate BMI.    Physical Exam:    ***   Electronically signed by:  Aleen [facility]ells [facility] 8:39 AM 04/26/23

## 2023-05-09 ENCOUNTER — Ambulatory Visit: Payer: No Typology Code available for payment source | Admitting: Sports Medicine

## 2023-12-28 ENCOUNTER — Emergency Department (HOSPITAL_COMMUNITY)
Admission: EM | Admit: 2023-12-28 | Discharge: 2023-12-29 | Disposition: A | Discharge status: Home / Self Care | Attending: Emergency Medicine | Admitting: Emergency Medicine

## 2023-12-28 ENCOUNTER — Other Ambulatory Visit: Payer: Self-pay | Admitting: Gastroenterology

## 2023-12-28 ENCOUNTER — Other Ambulatory Visit: Payer: Self-pay

## 2023-12-28 ENCOUNTER — Encounter (HOSPITAL_COMMUNITY): Payer: Self-pay | Admitting: Emergency Medicine

## 2023-12-28 DIAGNOSIS — D649 Anemia, unspecified: Secondary | ICD-10-CM | POA: Diagnosis not present

## 2023-12-28 DIAGNOSIS — R0602 Shortness of breath: Secondary | ICD-10-CM | POA: Diagnosis present

## 2023-12-28 DIAGNOSIS — D500 Iron deficiency anemia secondary to blood loss (chronic): Secondary | ICD-10-CM | POA: Diagnosis not present

## 2023-12-28 LAB — CBC
HCT: 27.5 % — ABNORMAL LOW (ref 36.0–46.0)
Hemoglobin: 7 g/dL — ABNORMAL LOW (ref 12.0–15.0)
MCH: 16.7 pg — ABNORMAL LOW (ref 26.0–34.0)
MCHC: 25.5 g/dL — ABNORMAL LOW (ref 30.0–36.0)
MCV: 65.5 fL — ABNORMAL LOW (ref 80.0–100.0)
Platelets: 541 10*3/uL — ABNORMAL HIGH (ref 150–400)
RBC: 4.2 MIL/uL (ref 3.87–5.11)
RDW: 19.5 % — ABNORMAL HIGH (ref 11.5–15.5)
WBC: 7.5 10*3/uL (ref 4.0–10.5)
nRBC: 0 % (ref 0.0–0.2)

## 2023-12-28 LAB — BASIC METABOLIC PANEL WITH GFR
Anion gap: 10 (ref 5–15)
BUN: 12 mg/dL (ref 6–20)
CO2: 25 mmol/L (ref 22–32)
Calcium: 9.1 mg/dL (ref 8.9–10.3)
Chloride: 100 mmol/L (ref 98–111)
Creatinine, Ser: 0.87 mg/dL (ref 0.44–1.00)
GFR, Estimated: >60 mL/min (ref >60)
Glucose, Bld: 97 mg/dL (ref 70–99)
Potassium: 3.7 mmol/L (ref 3.5–5.1)
Sodium: 135 mmol/L (ref 135–145)

## 2023-12-28 LAB — HCG, SERUM, QUALITATIVE: Preg, Serum: NEGATIVE

## 2023-12-28 LAB — TROPONIN I (HIGH SENSITIVITY): Troponin I (High Sensitivity): 2 ng/L (ref <18)

## 2023-12-28 MED ORDER — SODIUM CHLORIDE 0.9% IV SOLUTION: Freq: Once | INTRAVENOUS | Status: AC

## 2023-12-28 MED ORDER — ACETAMINOPHEN 500 MG PO TABS
1000.0000 mg | ORAL_TABLET | Freq: Once | ORAL | Status: AC
  Administered 2023-12-28: 1000 mg via ORAL
  Filled 2023-12-28: qty 2

## 2023-12-28 MED ORDER — METOCLOPRAMIDE HCL 5 MG/ML IJ SOLN
10.0000 mg | Freq: Once | INTRAMUSCULAR | Status: AC
  Administered 2023-12-29: 10 mg via INTRAVENOUS
  Filled 2023-12-28: qty 2

## 2023-12-28 NOTE — ED Notes (Signed)
 Lab to run Type and screen sent down earlier.

## 2023-12-28 NOTE — ED Triage Notes (Addendum)
 Patient MD sent her for blood transfusion. Hemoglobin level is 7 per patient. Patient also presents due to shortness of breath, chest pain and headache for the past month.

## 2023-12-28 NOTE — ED Notes (Signed)
 Patient stated she has a 8/10 headache. Comes and goes. Hx of migraines per patient

## 2023-12-29 ENCOUNTER — Emergency Department (HOSPITAL_COMMUNITY)

## 2023-12-29 DIAGNOSIS — D500 Iron deficiency anemia secondary to blood loss (chronic): Secondary | ICD-10-CM | POA: Diagnosis not present

## 2023-12-29 LAB — PREPARE RBC (CROSSMATCH)

## 2023-12-29 LAB — ABO/RH: ABO/RH(D): B POS

## 2023-12-29 NOTE — Discharge Instructions (Signed)
 You were transfused 2 units of blood in the emergency department.  Please follow-up closely with your gynecology office, as well as with hematology at [facility] regarding further workup and management of your heavy menses and iron  deficiency anemia.

## 2023-12-29 NOTE — Consult Note (Signed)
 [facility] Initial Consultation Note  Sherri Lindsey ZOX:[id number] DOB: [birth year] DOA: 12/28/2023  PCP: System, Provider Not In   Requesting Physician: Rancour   Reason for Consultation: Iron  deficiency anemia  HPI: Sherri Lindsey is a 48 y.o. 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  Patient has known heavy menses, lasting up to 6 days/month.  She was previously followed as an outpatient by oncology, received IV iron  infusions last year.  After her oncologist retired, she unfortunately was lost to follow-up.  In the interim, she was more recently seen by gynecology, they started her on OCP.  Patient states that the last 2 months, her menses have become even more heavy, lasting approximately 10 days.  Over the last 3 or 4 months, she has developed progressive headache, chest discomfort, shortness of breath with minimal exertion.  Review of Systems: Please see HPI for pertinent positives and negatives. A complete 10 system review of systems are otherwise negative.  Past Medical History:  Diagnosis Date   Acid reflux    Anemia    Edema 07/12/2012   intermittent since 2014, diuretic use prior   Gestational diabetes 07/12/2000   Migraine    Strabismus    Ulcer    Wears glasses    Past Surgical History:  Procedure Laterality Date   CESAREAN SECTION     2002, 2005   CHOLECYSTECTOMY     KNEE SURGERY     arthroscopic   TONSILLECTOMY      Social History:  reports that she has never smoked. She has never been exposed to tobacco smoke. She has never used smokeless tobacco. She reports current alcohol use. She reports that she does not use drugs.  No Known Allergies  Family History  Problem Relation Age of Onset   Hypertension Mother    Diabetes Mother     Hypertension Father    Kidney failure Father    Hyperlipidemia Father    Hypertension Sister    Kidney failure Sister    Hypertension Brother    Hypertension Maternal Grandmother    Heart disease Neg Hx    Stomach cancer Neg Hx    Rectal cancer Neg Hx    Esophageal cancer Neg Hx    Colon cancer Neg Hx      Prior to Admission medications   Medication Sig Start Date End Date Taking? Authorizing Provider  acetaminophen  (TYLENOL ) 500 MG tablet Take 500 mg by mouth every 6 (six) hours as needed for moderate pain (pain score 4-6).   Yes [provider]  pantoprazole  (PROTONIX ) 40 MG tablet Take 1 tablet (40 mg total) by mouth daily. Please call [phone] to schedule an office visit for more refills 12/28/23  Yes Lajuan Pila, MD  cyclobenzaprine  (FLEXERIL ) 5 MG tablet Take 1 tablet (5 mg total) by mouth 3 (three) times daily as needed for muscle spasms. Patient not taking: Reported on 12/29/2023 02/18/23   Mayer, Jodi R, NP  gabapentin  (NEURONTIN ) 100 MG capsule Take 1 capsule (100 mg total) by mouth at bedtime as needed. Patient not taking: Reported on 12/29/2023 02/25/23   Ulysees Gander, DO  HAILEY 24 FE 1-20 MG-MCG(24) tablet TAKE 1 TABLET BY MOUTH EVERY DAY Patient not taking: Reported on 12/29/2023 04/13/23   Chrzanowski, Jami B, NP  predniSONE  (DELTASONE ) 20 MG tablet Take 1 tablet (20 mg total) by  mouth 2 (two) times daily with a meal. Patient not taking: Reported on 12/29/2023 01/20/23   Alexander Iba, PA    Physical Exam: BP (!) 185/90   Pulse (!) 56   Temp 98.3 F (36.8 C) (Oral)   Resp 16   Ht 5' 3 (1.6 m)   Wt 129.3 kg   LMP  (LMP Unknown)   SpO2 100%   BMI 50.49 kg/m   General:  Alert, oriented, calm, in no acute distress  Eyes: EOMI, clear conjuctivae, Rowles sclerea Neck: supple, no masses, trachea mildline  Cardiovascular: RRR, no murmurs or rubs, no peripheral edema  Respiratory: clear to auscultation bilaterally, no wheezes, no crackles  Abdomen:  soft, nontender, nondistended, normal bowel tones heard  Skin: dry, no rashes  Musculoskeletal: no joint effusions, normal range of motion  Psychiatric: appropriate affect, normal speech  Neurologic: extraocular muscles intact, clear speech, moving all extremities with intact sensorium         Recent Labs and Imaging Reviewed:  Basic Metabolic Panel: Recent Labs  Lab 12/28/23 1928  NA 135  K 3.7  CL 100  CO2 25  GLUCOSE 97  BUN 12  CREATININE 0.87  CALCIUM 9.1   Liver Function Tests: No results for input(s): AST, ALT, ALKPHOS, BILITOT, PROT, ALBUMIN in the last 168 hours. No results for input(s): LIPASE, AMYLASE in the last 168 hours. No results for input(s): AMMONIA in the last 168 hours. CBC: Recent Labs  Lab 12/28/23 1928  WBC 7.5  HGB 7.0*  HCT 27.5*  MCV 65.5*  PLT 541*   Cardiac Enzymes: No results for input(s): CKTOTAL, CKMB, CKMBINDEX, TROPONINI in the last 168 hours.  BNP (last 3 results) No results for input(s): BNP in the last 8760 hours.  ProBNP (last 3 results) No results for input(s): PROBNP in the last 8760 hours.  CBG: No results for input(s): GLUCAP in the last 168 hours.  Radiological Exams on Admission: DG Chest Port 1 View Result Date: 12/29/2023 CLINICAL DATA:  Dyspnea. EXAM: PORTABLE CHEST 1 VIEW COMPARISON:  09/24/2022 FINDINGS: The heart size and mediastinal contours are within normal limits. Both lungs are clear. The visualized skeletal structures are unremarkable. IMPRESSION: No active disease. Electronically Signed   By: Kimberley Penman M.D.   On: 12/29/2023 05:47   Summary and Recommendations: Sherri Lindsey is a 48 y.o. 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   She has known  dysfunctional uterine bleeding, and related iron  deficiency anemia. Unfortunately she was lost to follow-up last year. She recently was placed on OCP by gynecology which unfortunately resulted in worsening bleeding which has likely exacerbated her symptomatic anemia.  She has been transfused 2 unit PRBCs, and workup as described above is unremarkable for any other complication.  Troponin unremarkable, chest pain has resolved.  Patient has ambulated in the ER and symptoms are significantly improved.  Discussed with the patient that there would be no added benefit to hospital admission.  I recommend she be discharged home, and follow-up closely with gynecology.  She is in agreement with this.  I have also sent a message to hematology Dr. Rosaline Coma, requesting that their office contact the patient about reestablishing care with their office.  As such, patient will be discharged home in stable condition.  Time spent: 49 minutes  Jerard Bays Rickey Charm MD [facility] Pager [phone]  If 7PM-7AM, please contact night-coverage [email or web address] Password [email or web address]  12/29/2023, 8:09 AM

## 2023-12-29 NOTE — ED Notes (Signed)
 Patient resting at this time. Has no complaints at the moment.

## 2023-12-29 NOTE — ED Provider Notes (Addendum)
 [place] EMERGENCY DEPARTMENT AT [facility] Provider Note   CSN: [id number] Arrival date & time: 12/28/23  1836     Patient presents with: Chest Pain, Abnormal Lab, and Shortness of Breath   Sherri Lindsey is a 48 y.o. female.  Patient with past medical history significant for deficiency anemia, menorrhagia, iron  infusions, migraines presents the emergency department complaining of 1 month of worsening shortness of breath with exertion and a generalized headache which began today with no neurologic complaints.  Patient went to an urgent care and was told to come to the emergency department for possible blood transfusion due to low hemoglobin.  Patient does state that she feels some pain in her chest when she feels that she is breathing hard with exertion.  She denies abdominal pain, nausea, vomiting.  She states that GYN had started her on oral birth control but she stopped taking it due to side effects.  She also states that she saw no improvement with her menstrual bleeding with the birth control prescription.  She typically bleeds heavily for approximately 1 week with each menstrual cycle.  She states that she finished her menstrual cycle yesterday.    Chest Pain Associated symptoms: shortness of breath   Abnormal Lab Shortness of Breath Associated symptoms: chest pain        Prior to Admission medications   Medication Sig Start Date End Date Taking? Authorizing Provider  acetaminophen  (TYLENOL ) 500 MG tablet Take 500 mg by mouth every 6 (six) hours as needed for moderate pain (pain score 4-6).   Yes [provider]  pantoprazole  (PROTONIX ) 40 MG tablet Take 1 tablet (40 mg total) by mouth daily. Please call [phone] to schedule an office visit for more refills 12/28/23  Yes Charlanne Groom, MD  cyclobenzaprine  (FLEXERIL ) 5 MG tablet Take 1 tablet (5 mg total) by mouth 3 (three) times daily as needed for muscle spasms. Patient not taking: Reported on 12/29/2023  02/18/23   Mayer, Jodi R, NP  gabapentin  (NEURONTIN ) 100 MG capsule Take 1 capsule (100 mg total) by mouth at bedtime as needed. Patient not taking: Reported on 12/29/2023 02/25/23   Leonce Katz, DO  HAILEY 24 FE 1-20 MG-MCG(24) tablet TAKE 1 TABLET BY MOUTH EVERY DAY Patient not taking: Reported on 12/29/2023 04/13/23   Chrzanowski, Jami B, NP  predniSONE  (DELTASONE ) 20 MG tablet Take 1 tablet (20 mg total) by mouth 2 (two) times daily with a meal. Patient not taking: Reported on 12/29/2023 01/20/23   Job Lukes, PA    Allergies: Patient has no known allergies.    Review of Systems  Respiratory:  Positive for shortness of breath.   Cardiovascular:  Positive for chest pain.    Updated Vital Signs BP (!) 185/90   Pulse (!) 56   Temp 98.3 F (36.8 C) (Oral)   Resp 16   Ht 5' 3 (1.6 m)   Wt 129.3 kg   LMP  (LMP Unknown)   SpO2 100%   BMI 50.49 kg/m   Physical Exam Vitals and nursing note reviewed.  Constitutional:      General: She is not in acute distress.    Appearance: She is well-developed.  HENT:     Head: Normocephalic and atraumatic.   Eyes:     Conjunctiva/sclera: Conjunctivae normal.    Cardiovascular:     Rate and Rhythm: Normal rate and regular rhythm.  Pulmonary:     Effort: Pulmonary effort is normal. No respiratory distress.     Breath  sounds: Normal breath sounds.  Chest:     Chest wall: No tenderness.  Abdominal:     Palpations: Abdomen is soft.     Tenderness: There is no abdominal tenderness.   Musculoskeletal:        General: No swelling.     Cervical back: Neck supple.   Skin:    General: Skin is warm and dry.     Capillary Refill: Capillary refill takes less than 2 seconds.     Coloration: Skin is not pale.   Neurological:     Mental Status: She is alert.   Psychiatric:        Mood and Affect: Mood normal.     (all labs ordered are listed, but only abnormal results are displayed) Labs Reviewed  CBC - Abnormal; Notable for  the following components:      Result Value   Hemoglobin 7.0 (*)    HCT 27.5 (*)    MCV 65.5 (*)    MCH 16.7 (*)    MCHC 25.5 (*)    RDW 19.5 (*)    Platelets 541 (*)    All other components within normal limits  BASIC METABOLIC PANEL WITH GFR  HCG, SERUM, QUALITATIVE  PREPARE RBC (CROSSMATCH)  TYPE AND SCREEN  ABO/RH  TROPONIN I (HIGH SENSITIVITY)  TROPONIN I (HIGH SENSITIVITY)    EKG: None  Radiology: [facility] Chest Port 1 View Result Date: 12/29/2023 CLINICAL DATA:  Dyspnea. EXAM: PORTABLE CHEST 1 VIEW COMPARISON:  09/24/2022 FINDINGS: The heart size and mediastinal contours are within normal limits. Both lungs are clear. The visualized skeletal structures are unremarkable. IMPRESSION: No active disease. Electronically Signed   By: Waddell Calk M.D.   On: 12/29/2023 05:47     .Critical Care  Performed by: Logan Ubaldo NOVAK, PA-C Authorized by: Logan Ubaldo NOVAK, PA-C   Critical care provider statement:    Critical care time (minutes):  30   Critical care time was exclusive of:  Separately billable procedures and treating other patients   Critical care was necessary to treat or prevent imminent or life-threatening deterioration of the following conditions: Symptomatic anemia requiring blood transfusion.   Critical care was time spent personally by me on the following activities:  Development of treatment plan with patient or surrogate, discussions with consultants, evaluation of patient's response to treatment, examination of patient, ordering and review of laboratory studies, ordering and review of radiographic studies, ordering and performing treatments and interventions, pulse oximetry, re-evaluation of patient's condition and review of old charts   Care discussed with: admitting provider      Medications Ordered in the ED  0.9 %  sodium chloride  infusion (Manually program via Guardrails IV Fluids) (0 mLs Intravenous Stopped 12/29/23 0620)  metoCLOPramide  (REGLAN ) injection  10 mg (10 mg Intravenous Given 12/29/23 0004)  acetaminophen  (TYLENOL ) tablet 1,000 mg (1,000 mg Oral Given 12/28/23 2349)                                    Medical Decision Making Amount and/or Complexity of Data Reviewed Labs: ordered. Radiology: ordered.  Risk OTC drugs. Prescription drug management. Decision regarding hospitalization.   This patient presents to the ED for concern of shortness of breath, this involves an extensive number of treatment options, and is a complaint that carries with it a high risk of complications and morbidity.  The differential diagnosis includes symptomatic anemia, PE, ACS, others  Co morbidities / Chronic conditions that complicate the patient evaluation  Menorrhagia, iron  deficiency anemia   Additional history obtained:  Additional history obtained from EMR External records from outside source obtained and reviewed including oncology notes   Lab Tests:  I Ordered, and personally interpreted labs.  The pertinent results include: Hemoglobin 7.0, microcytic anemia pattern   Imaging:  I ordered and personally interpreted imaging including a chest x-ray.  No acute findings on chest x-ray.   Cardiac Monitoring: / EKG:  The patient was maintained on a cardiac monitor.  I personally viewed and interpreted the cardiac monitored which showed an underlying rhythm of: Sinus rhythm   Problem List / ED Course / Critical interventions / Medication management   I ordered medication including 2 units PRBC, Tylenol , Reglan  Reevaluation of the patient after these medicines showed that the patient improved I have reviewed the patients home medicines and have made adjustments as needed   Consultations Obtained:  I requested consultation with the hospitalist   Social Determinants of Health:  Social Drivers of Health with Concerns   Financial Resource Strain: High Risk (12/23/2023)   Received from [facility]   Financial  Resource Strain    How hard is it for you to pay for the very basics like food, housing, medical care, and heating?: Hard  Food Insecurity: Food Insecurity Present (12/23/2023)   Received from [facility]   Hunger Vital Sign    Within the past 12 months, you worried that your food would run out before you got the money to buy more.: Sometimes true    Within the past 12 months, the food you bought just didn't last and you didn't have money to get more.: Sometimes true  Physical Activity: Insufficiently Active (12/23/2023)   Received from [facility]   Exercise Vital Sign    On average, how many days per week do you engage in moderate to strenuous exercise (like a brisk walk)?: 3 days    On average, how many minutes do you engage in exercise at this level?: 20 min  Stress: Stress Concern Present (12/23/2023)   Received from [facility]   [employer] of Occupational Health - Occupational Stress Questionnaire    Feeling of Stress : Very much  Alcohol Screen: Not on file  Utilities: At Risk (12/23/2023)   Received from [facility]   [facility] Utilities    Threatened with loss of utilities: Yes      Test / Admission - Considered:  Patient with symptomatic anemia upon arrival, likely due to menorrhagia which has been addressed previously by both GYN and heme-onc.  Plan to transfuse 2 units PRBC and reassess patient.  Patient continuing to feel mildly short of breath.  I feel the patient would benefit at this time for admission for further management of her symptomatic anemia      Final diagnoses:  Symptomatic anemia    ED Discharge Orders     None          Logan Ubaldo KATHEE DEVONNA 12/29/23 9357    Carita Senior, MD 12/29/23 0724    Logan Ubaldo KATHEE, PA-C 01/09/24 0258    Carita Senior, MD 01/09/24 [phone]

## 2023-12-30 LAB — TYPE AND SCREEN
ABO/RH(D): B POS
Antibody Screen: NEGATIVE
Unit division: 0
Unit division: 0

## 2023-12-30 LAB — BPAM RBC
Blood Product Expiration Date: 202507192359
Blood Product Expiration Date: 202507192359
ISSUE DATE / TIME: 202506190051
ISSUE DATE / TIME: 202506190324
Unit Type and Rh: 7300
Unit Type and Rh: 7300

## 2024-01-09 ENCOUNTER — Other Ambulatory Visit: Payer: Self-pay | Admitting: Physician Assistant

## 2024-01-09 DIAGNOSIS — D500 Iron deficiency anemia secondary to blood loss (chronic): Secondary | ICD-10-CM

## 2024-01-10 ENCOUNTER — Inpatient Hospital Stay

## 2024-01-10 ENCOUNTER — Inpatient Hospital Stay: Attending: Physician Assistant | Admitting: Physician Assistant

## 2024-01-10 ENCOUNTER — Telehealth: Payer: Self-pay

## 2024-01-10 VITALS — BP 131/81 | HR 66 | Temp 97.6°F | Resp 19 | Wt 281.4 lb

## 2024-01-10 DIAGNOSIS — Z9049 Acquired absence of other specified parts of digestive tract: Secondary | ICD-10-CM | POA: Diagnosis not present

## 2024-01-10 DIAGNOSIS — Z79899 Other long term (current) drug therapy: Secondary | ICD-10-CM | POA: Insufficient documentation

## 2024-01-10 DIAGNOSIS — Z5986 Financial insecurity: Secondary | ICD-10-CM | POA: Diagnosis not present

## 2024-01-10 DIAGNOSIS — Z841 Family history of disorders of kidney and ureter: Secondary | ICD-10-CM | POA: Insufficient documentation

## 2024-01-10 DIAGNOSIS — Z9089 Acquired absence of other organs: Secondary | ICD-10-CM | POA: Insufficient documentation

## 2024-01-10 DIAGNOSIS — D500 Iron deficiency anemia secondary to blood loss (chronic): Secondary | ICD-10-CM | POA: Diagnosis present

## 2024-01-10 DIAGNOSIS — Z8249 Family history of ischemic heart disease and other diseases of the circulatory system: Secondary | ICD-10-CM | POA: Insufficient documentation

## 2024-01-10 DIAGNOSIS — R5383 Other fatigue: Secondary | ICD-10-CM | POA: Diagnosis not present

## 2024-01-10 DIAGNOSIS — Z833 Family history of diabetes mellitus: Secondary | ICD-10-CM | POA: Insufficient documentation

## 2024-01-10 DIAGNOSIS — R0602 Shortness of breath: Secondary | ICD-10-CM | POA: Diagnosis not present

## 2024-01-10 DIAGNOSIS — N920 Excessive and frequent menstruation with regular cycle: Secondary | ICD-10-CM | POA: Diagnosis not present

## 2024-01-10 DIAGNOSIS — Z5941 Food insecurity: Secondary | ICD-10-CM | POA: Diagnosis not present

## 2024-01-10 DIAGNOSIS — Z83438 Family history of other disorder of lipoprotein metabolism and other lipidemia: Secondary | ICD-10-CM | POA: Diagnosis not present

## 2024-01-10 LAB — CBC WITH DIFFERENTIAL (CANCER CENTER ONLY)
Abs Immature Granulocytes: 0.01 10*3/uL (ref 0.00–0.07)
Basophils Absolute: 0.1 10*3/uL (ref 0.0–0.1)
Basophils Relative: 1 %
Eosinophils Absolute: 0.1 10*3/uL (ref 0.0–0.5)
Eosinophils Relative: 1 %
HCT: 30.2 % — ABNORMAL LOW (ref 36.0–46.0)
Hemoglobin: 8.7 g/dL — ABNORMAL LOW (ref 12.0–15.0)
Immature Granulocytes: 0 %
Lymphocytes Relative: 25 %
Lymphs Abs: 1.7 10*3/uL (ref 0.7–4.0)
MCH: 19.2 pg — ABNORMAL LOW (ref 26.0–34.0)
MCHC: 28.8 g/dL — ABNORMAL LOW (ref 30.0–36.0)
MCV: 66.5 fL — ABNORMAL LOW (ref 80.0–100.0)
Monocytes Absolute: 0.4 10*3/uL (ref 0.1–1.0)
Monocytes Relative: 6 %
Neutro Abs: 4.7 10*3/uL (ref 1.7–7.7)
Neutrophils Relative %: 67 %
Platelet Count: 203 10*3/uL (ref 150–400)
RBC: 4.54 MIL/uL (ref 3.87–5.11)
RDW: 25.9 % — ABNORMAL HIGH (ref 11.5–15.5)
WBC Count: 6.9 10*3/uL (ref 4.0–10.5)
nRBC: 0 % (ref 0.0–0.2)

## 2024-01-10 LAB — CMP (CANCER CENTER ONLY)
ALT: 10 U/L (ref 0–44)
AST: 13 U/L — ABNORMAL LOW (ref 15–41)
Albumin: 4.1 g/dL (ref 3.5–5.0)
Alkaline Phosphatase: 69 U/L (ref 38–126)
Anion gap: 6 (ref 5–15)
BUN: 9 mg/dL (ref 6–20)
CO2: 25 mmol/L (ref 22–32)
Calcium: 8.8 mg/dL — ABNORMAL LOW (ref 8.9–10.3)
Chloride: 106 mmol/L (ref 98–111)
Creatinine: 1.01 mg/dL — ABNORMAL HIGH (ref 0.44–1.00)
GFR, Estimated: >60 mL/min (ref >60)
Glucose, Bld: 135 mg/dL — ABNORMAL HIGH (ref 70–99)
Potassium: 3.9 mmol/L (ref 3.5–5.1)
Sodium: 137 mmol/L (ref 135–145)
Total Bilirubin: 0.8 mg/dL (ref 0.0–1.2)
Total Protein: 7.7 g/dL (ref 6.5–8.1)

## 2024-01-10 LAB — IRON AND IRON BINDING CAPACITY (CC-WL,HP ONLY)
Iron: 21 ug/dL — ABNORMAL LOW (ref 28–170)
Saturation Ratios: 5 % — ABNORMAL LOW (ref 10.4–31.8)
TIBC: 445 ug/dL (ref 250–450)
UIBC: 424 ug/dL (ref 148–442)

## 2024-01-10 LAB — FERRITIN: Ferritin: 7 ng/mL — ABNORMAL LOW (ref 11–307)

## 2024-01-10 MED ORDER — FERROUS SULFATE 325 (65 FE) MG PO TBEC: 325.0000 mg | DELAYED_RELEASE_TABLET | Freq: Every day | ORAL | 3 refills | Status: AC

## 2024-01-10 NOTE — Progress Notes (Signed)
 [facility] Telephone:([phone]) [phone]   Fax:(336) [phone]  PROGRESS NOTE  Patient Care Team: System, Provider Not In as PCP - General  CHIEF COMPLAINTS/PURPOSE OF CONSULTATION:  Iron  deficiency anemia 2/2 menorrhagia  HISTORY OF PRESENTING ILLNESS:  Sherri Lindsey 48 y.o. female returns for a follow up for iron  deficiency anemia. She was last seen by Dr. Bernie on 10/01/2022. She presents today for a follow up.   On exam today, Sherri Lindsey recently presented to ED on 12/29/2023 due to symptomatic anemia and received 2 units of PRBC with minimal improvement of fatigue. She continues to feel exhausted which impacts her ADLs. She has shortness of breath with exertion and recurrent episodes of dizziness. She craves ice throughout the day. She continues to have heavy menstrual cycles last 7-10 days with 5 days of heavy bleeding. She requires changing her pad/tampon every hour the first 5 days. She plans to establish care with OB/GYN in the next couple of weeks. She has occasional nose bleeds but no other signs of bleeding. She denies fevers, chills, sweats, chest pain, cough or headaches. She has no other complaints. Rest of the ROS is below.   MEDICAL HISTORY:  Past Medical History:  Diagnosis Date   Acid reflux    Anemia    Edema 07/12/2012   intermittent since 2014, diuretic use prior   Gestational diabetes 07/12/2000   Migraine    Strabismus    Ulcer    Wears glasses     SURGICAL HISTORY: Past Surgical History:  Procedure Laterality Date   CESAREAN SECTION     2002, 2005   CHOLECYSTECTOMY     KNEE SURGERY     arthroscopic   TONSILLECTOMY      SOCIAL HISTORY: Social History   Socioeconomic History   Marital status: Single    Spouse name: Not on file   Number of children: 2   Years of education: Not on file   Highest education level: Not on file  Occupational History   Occupation: customer service  Tobacco Use   Smoking status: Never    Passive exposure:  Never   Smokeless tobacco: Never  Vaping Use   Vaping status: Never Used  Substance and Sexual Activity   Alcohol use: Yes    Comment: rarely   Drug use: No   Sexual activity: Not Currently    Partners: Male    Birth control/protection: Condom    Comment: menarche 48yo, sexual debut 48yo  Other Topics Concern   Not on file  Social History Narrative   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, mom is currently independent   Social Drivers of Health   Financial Resource Strain: High Risk (12/23/2023)   Received from [facility]   Financial Resource Strain    How hard is it for you to pay for the very basics like food, housing, medical care, and heating?: Hard  Food Insecurity: Food Insecurity Present (12/23/2023)   Received from [facility]   Hunger Vital Sign    Within the past 12 months, you worried that your food would run out before you got the money to buy more.: Sometimes true    Within the past 12 months, the food you bought just didn't last and you didn't have money to get more.: Sometimes true  Transportation Needs: No Transportation Needs (12/23/2023)   Received from [facility]   PRAPARE - Transportation  Lack of Transportation (Medical): No    Lack of Transportation (Non-Medical): No  Physical Activity: Insufficiently Active (12/23/2023)   Received from [facility]   Exercise Vital Sign    On average, how many days per week do you engage in moderate to strenuous exercise (like a brisk walk)?: 3 days    On average, how many minutes do you engage in exercise at this level?: 20 min  Stress: Stress Concern Present (12/23/2023)   Received from [facility]   [employer] of Occupational Health - Occupational Stress Questionnaire    Feeling of Stress : Very much  Social Connections: Moderately Integrated (12/23/2023)   Received from [facility]   Social  Connections    In a typical week, how many times do you talk on the phone with family, friends, or neighbors?: More than three times a week    How often do you get together with friends or relatives?: Once a week    How often do you attend church or religious services?: More than 4 times per year    Do you belong to any clubs or organizations such as church groups, unions, fraternal or athletic groups, or school groups?: Yes    How often do you attend meetings of the clubs or organizations you belong to?: More than 4 times per year    Are you married, widowed, divorced, separated, never married, or living with a partner?: Divorced  Intimate Partner Violence: Not At Risk (08/27/2022)   Humiliation, Afraid, Rape, and Kick questionnaire    Fear of Current or Ex-Partner: No    Emotionally Abused: No    Physically Abused: No    Sexually Abused: No    FAMILY HISTORY: Family History  Problem Relation Age of Onset   Hypertension Mother    Diabetes Mother    Hypertension Father    Kidney failure Father    Hyperlipidemia Father    Hypertension Sister    Kidney failure Sister    Hypertension Brother    Hypertension Maternal Grandmother    Heart disease Neg Hx    Stomach cancer Neg Hx    Rectal cancer Neg Hx    Esophageal cancer Neg Hx    Colon cancer Neg Hx     ALLERGIES:  has no known allergies.  MEDICATIONS:  Current Outpatient Medications  Medication Sig Dispense Refill   acetaminophen  (TYLENOL ) 500 MG tablet Take 500 mg by mouth every 6 (six) hours as needed for moderate pain (pain score 4-6).     pantoprazole  (PROTONIX ) 40 MG tablet Take 1 tablet (40 mg total) by mouth daily. Please call [phone] to schedule an office visit for more refills 90 tablet 0   No current facility-administered medications for this visit.    REVIEW OF SYSTEMS:   Constitutional: ( - ) fevers, ( - )  chills , ( - ) night sweats Eyes: ( - ) blurriness of vision, ( - ) double vision, ( - ) watery  eyes Ears, nose, mouth, throat, and face: ( - ) mucositis, ( - ) sore throat Respiratory: ( - ) cough, ( + ) dyspnea, ( - ) wheezes Cardiovascular: ( - ) palpitation, ( - ) chest discomfort, ( - ) lower extremity swelling Gastrointestinal:  ( - ) nausea, ( - ) heartburn, ( - ) change in bowel habits Skin: ( - ) abnormal skin rashes Lymphatics: ( - ) new lymphadenopathy, ( - ) easy bruising Neurological: ( - )  numbness, ( - ) tingling, ( - ) new weaknesses Behavioral/Psych: ( - ) mood change, ( - ) new changes  All other systems were reviewed with the patient and are negative.  PHYSICAL EXAMINATION: ECOG PERFORMANCE STATUS: 1 - Symptomatic but completely ambulatory  Vitals:   01/10/24 1123  BP: 131/81  Pulse: 66  Resp: 19  Temp: 97.6 F (36.4 C)  SpO2: 100%   Filed Weights   01/10/24 1123  Weight: 281 lb 6.4 oz (127.6 kg)    GENERAL: well appearing female in NAD  SKIN: skin color, texture, turgor are normal, no rashes or significant lesions EYES: conjunctiva are pink and non-injected, sclera clear LUNGS: clear to auscultation and percussion with normal breathing effort HEART: regular rate & rhythm and no murmurs and no lower extremity edema Musculoskeletal: no cyanosis of digits and no clubbing  PSYCH: alert & oriented x 3, fluent speech NEURO: no focal motor/sensory deficits  LABORATORY DATA:  I have reviewed the data as listed    Latest Ref Rng & Units 01/10/2024   10:58 AM 12/28/2023    7:28 PM 10/01/2022    9:31 AM  CBC  WBC 4.0 - 10.5 K/uL 6.9  7.5  4.7   Hemoglobin 12.0 - 15.0 g/dL 8.7  7.0  8.0   Hematocrit 36.0 - 46.0 % 30.2  27.5  29.5   Platelets 150 - 400 K/uL 203  541  342        Latest Ref Rng & Units 01/10/2024   10:58 AM 12/28/2023    7:28 PM 10/01/2022    9:31 AM  CMP  Glucose 70 - 99 mg/dL 864  97  897   BUN 6 - 20 mg/dL 9  12  7    Creatinine 0.44 - 1.00 mg/dL 8.98  9.12  9.04   Sodium 135 - 145 mmol/L 137  135  138   Potassium 3.5 - 5.1 mmol/L 3.9   3.7  3.8   Chloride 98 - 111 mmol/L 106  100  105   CO2 22 - 32 mmol/L 25  25  24    Calcium 8.9 - 10.3 mg/dL 8.8  9.1  9.1   Total Protein 6.5 - 8.1 g/dL 7.7   7.9   Total Bilirubin 0.0 - 1.2 mg/dL 0.8   0.6   Alkaline Phos 38 - 126 U/L 69   69   AST 15 - 41 U/L 13   12   ALT 0 - 44 U/L 10   7     RADIOGRAPHIC STUDIES: I have personally reviewed the radiological images as listed and agreed with the findings in the report. DG Chest Port 1 View Result Date: 12/29/2023 CLINICAL DATA:  Dyspnea. EXAM: PORTABLE CHEST 1 VIEW COMPARISON:  09/24/2022 FINDINGS: The heart size and mediastinal contours are within normal limits. Both lungs are clear. The visualized skeletal structures are unremarkable. IMPRESSION: No active disease. Electronically Signed   By: Waddell Calk M.D.   On: 12/29/2023 05:47    ASSESSMENT & PLAN Sherri Lindsey is a 48 y.o. female who presents to the clinic for a follow up for iron  deficiency anemia.   # Iron  Deficiency Anemia 2/2 to GYN Bleeding --Findings are consistent with iron  deficiency anemia secondary to patient's menorrhagia --Encouraged her to follow-up with OB/GYN for better control of her menstrual cycles, scheduled for consultation on 01/27/2024 --Labs from today show persistent anemia with Hgb 8.7, MCV 66.5. Iron  panel shows deficiency with iron  21, saturation 5%, ferritin pending.  --  Recommend ferrous sulfate 325 mg daily with a source of vitamin C --Encouraged to eat iron  rich foods.  --We will plan to proceed with IV iron  therapy in order to help bolster the patient's blood counts --RTC for lab check in 4 and 8 weeks then labs/follow up in 12 weeks.    Orders Placed This Encounter  Procedures   CBC with Differential (Cancer Center Only)    Standing Status:   Standing    Number of Occurrences:   3    Expiration Date:   01/09/2025   Ferritin    Standing Status:   Standing    Number of Occurrences:   3    Expiration Date:   01/09/2025   Iron  and Iron   Binding Capacity ([facility] only)    Standing Status:   Standing    Number of Occurrences:   3    Expiration Date:   01/09/2025    All questions were answered. The patient knows to call the clinic with any problems, questions or concerns.  I have spent a total of 30 minutes minutes of face-to-face and non-face-to-face time, preparing to see the patient,  performing a medically appropriate examination, counseling and educating the patient, ordering medications/tests/procedures, referring and communicating with other health care professionals, documenting clinical information in the electronic health record, independently interpreting results and communicating results to the patient, and care coordination.   Johnston Police, PA-C Department of Hematology/Oncology [facility] Phone: [phone]

## 2024-01-10 NOTE — Telephone Encounter (Signed)
 Johnston, patient will be scheduled as soon as possible.  Auth Submission: NO AUTH NEEDED Site of care: Site of care: [facility] Payer: [employer] commercial Medication & CPT/J Code(s) submitted: Venofer  (Iron  Sucrose) J1756 Diagnosis Code:  Route of submission (phone, fax, portal):  Phone # Fax # Auth type: Buy/Bill PB Units/visits requested: 200mg  x 5 doses Reference number:  Approval from: 01/10/24 to 05/12/24

## 2024-01-10 NOTE — Addendum Note (Signed)
 Addended by: Vann Okerlund T on: 01/10/2024 03:03 PM   Modules accepted: Orders

## 2024-01-11 ENCOUNTER — Telehealth: Payer: Self-pay | Admitting: Physician Assistant

## 2024-01-11 NOTE — Telephone Encounter (Signed)
 Left the patient a voicemail with the scheduled appointment details.

## 2024-01-12 ENCOUNTER — Ambulatory Visit: Payer: Self-pay | Admitting: Physician Assistant

## 2024-01-20 ENCOUNTER — Ambulatory Visit (INDEPENDENT_AMBULATORY_CARE_PROVIDER_SITE_OTHER)

## 2024-01-20 VITALS — BP 139/93 | HR 85 | Temp 98.2°F | Resp 18 | Ht 62.0 in | Wt 281.0 lb

## 2024-01-20 DIAGNOSIS — N920 Excessive and frequent menstruation with regular cycle: Secondary | ICD-10-CM

## 2024-01-20 DIAGNOSIS — D500 Iron deficiency anemia secondary to blood loss (chronic): Secondary | ICD-10-CM

## 2024-01-20 MED ORDER — IRON SUCROSE 20 MG/ML IV SOLN
200.0000 mg | Freq: Once | INTRAVENOUS | Status: AC
  Administered 2024-01-20: 200 mg via INTRAVENOUS
  Filled 2024-01-20: qty 10

## 2024-01-20 MED ORDER — SODIUM CHLORIDE 0.9 % IV BOLUS
250.0000 mL | Freq: Once | INTRAVENOUS | Status: DC
  Filled 2024-01-20: qty 250

## 2024-01-20 NOTE — Progress Notes (Signed)
 Diagnosis: Acute Anemia  Provider:  Chilton Greathouse MD  Procedure: IV Push  IV Type: Peripheral, IV Location: R Antecubital  Venofer (Iron Sucrose), Dose: 200 mg  Post Infusion IV Care: Observation period completed and Peripheral IV Discontinued  Discharge: Condition: Good, Destination: Home . AVS Declined  Performed by:  Nat Math, RN

## 2024-01-27 ENCOUNTER — Encounter: Payer: Self-pay | Admitting: Radiology

## 2024-01-27 ENCOUNTER — Ambulatory Visit: Admitting: Radiology

## 2024-01-27 ENCOUNTER — Ambulatory Visit (INDEPENDENT_AMBULATORY_CARE_PROVIDER_SITE_OTHER)

## 2024-01-27 VITALS — BP 126/84 | Wt 275.0 lb

## 2024-01-27 VITALS — BP 134/84 | HR 69 | Temp 97.9°F | Resp 18 | Ht 63.0 in | Wt 278.0 lb

## 2024-01-27 DIAGNOSIS — N920 Excessive and frequent menstruation with regular cycle: Secondary | ICD-10-CM | POA: Diagnosis not present

## 2024-01-27 DIAGNOSIS — D500 Iron deficiency anemia secondary to blood loss (chronic): Secondary | ICD-10-CM | POA: Diagnosis not present

## 2024-01-27 LAB — PREGNANCY, URINE: Preg Test, Ur: NEGATIVE

## 2024-01-27 MED ORDER — IRON SUCROSE 20 MG/ML IV SOLN
200.0000 mg | Freq: Once | INTRAVENOUS | Status: AC
  Administered 2024-01-27: 200 mg via INTRAVENOUS
  Filled 2024-01-27: qty 10

## 2024-01-27 NOTE — Progress Notes (Signed)
 Diagnosis: Iron  Deficiency Anemia  Provider:  Praveen Mannam MD  Procedure: IV Push  IV Type: Peripheral, IV Location: R Upper Arm  Venofer  (Iron  Sucrose), Dose: 200 mg  Post Infusion IV Care: Patient declined observation and Peripheral IV Discontinued  Discharge: Condition: Good, Destination: Home . AVS Declined  Performed by:  Maximiano JONELLE Pouch, LPN

## 2024-01-27 NOTE — Progress Notes (Signed)
      Subjective: Sherri Lindsey is a 48 y.o. female who complains of heavy, painful periods x's 2 years. Symptoms increased since 06/2023. Lasting 10 days, very heavy, very painful. Receives regular iron  infusions. Tried Junel  last year, did not help. Denies every having a pelvic u/s. Not sexually active.      Review of Systems  All other systems reviewed and are negative.   Past Medical History:  Diagnosis Date   Acid reflux    Anemia    Edema 07/12/2012   intermittent since 2014, diuretic use prior   Gestational diabetes 07/12/2000   Migraine    Strabismus    Ulcer    Wears glasses       Objective:  Today's Vitals   01/27/24 1405  BP: 126/84  Weight: 275 lb (124.7 kg)   Body mass index is 50.3 kg/m.   Physical Exam Exam conducted with a chaperone present.  Constitutional:      Appearance: Normal appearance. She is obese.  Pulmonary:     Effort: Pulmonary effort is normal.  Abdominal:     General: Abdomen is flat. Bowel sounds are normal.     Palpations: Abdomen is soft.  Genitourinary:    General: Normal vulva.     Vagina: Normal. No vaginal discharge, erythema or bleeding.     Cervix: Normal.     Uterus: Normal.      Adnexa: Right adnexa normal and left adnexa normal.     Comments: Unable to fully assess ovaries due to habitus Neurological:     Mental Status: She is alert.  Psychiatric:        Mood and Affect: Mood normal.        Thought Content: Thought content normal.        Judgment: Judgment normal.     Darice Hoit, CMA present for exam  Assessment:/Plan:   1. Menorrhagia with regular cycle (Primary) - Pregnancy, urine; negative - US  PELVIS TRANSVAGINAL NON-OB (TV ONLY); Future    Return for u/s only asap.   Dvon Jiles B, NP 2:31 PM

## 2024-02-02 ENCOUNTER — Other Ambulatory Visit

## 2024-02-03 ENCOUNTER — Ambulatory Visit

## 2024-02-03 VITALS — BP 144/87 | HR 60 | Temp 97.6°F | Resp 18 | Ht 63.0 in | Wt 280.2 lb

## 2024-02-03 DIAGNOSIS — N920 Excessive and frequent menstruation with regular cycle: Secondary | ICD-10-CM | POA: Diagnosis not present

## 2024-02-03 DIAGNOSIS — D500 Iron deficiency anemia secondary to blood loss (chronic): Secondary | ICD-10-CM

## 2024-02-03 MED ORDER — IRON SUCROSE 20 MG/ML IV SOLN
200.0000 mg | Freq: Once | INTRAVENOUS | Status: AC
  Administered 2024-02-03: 200 mg via INTRAVENOUS
  Filled 2024-02-03: qty 10

## 2024-02-03 NOTE — Progress Notes (Signed)
 Diagnosis: Iron Deficiency Anemia  Provider:  Chilton Greathouse MD  Procedure: IV Push  IV Type: Peripheral, IV Location: R Forearm  Venofer (Iron Sucrose), Dose: 200 mg  Post Infusion IV Care: Patient declined observation and Peripheral IV Discontinued  Discharge: Condition: Good, Destination: Home . AVS Declined  Performed by:  Adriana Mccallum, RN

## 2024-02-07 ENCOUNTER — Ambulatory Visit (INDEPENDENT_AMBULATORY_CARE_PROVIDER_SITE_OTHER)

## 2024-02-07 ENCOUNTER — Telehealth: Payer: Self-pay | Admitting: Physician Assistant

## 2024-02-07 ENCOUNTER — Inpatient Hospital Stay

## 2024-02-07 DIAGNOSIS — N920 Excessive and frequent menstruation with regular cycle: Secondary | ICD-10-CM

## 2024-02-08 ENCOUNTER — Ambulatory Visit: Payer: Self-pay | Admitting: Radiology

## 2024-02-08 ENCOUNTER — Other Ambulatory Visit: Payer: Self-pay | Admitting: Radiology

## 2024-02-08 DIAGNOSIS — R9389 Abnormal findings on diagnostic imaging of other specified body structures: Secondary | ICD-10-CM

## 2024-02-08 MED ORDER — MISOPROSTOL 200 MCG PO TABS: 400.0000 ug | ORAL_TABLET | Freq: Once | ORAL | 0 refills | Status: DC

## 2024-02-09 ENCOUNTER — Encounter: Payer: Self-pay | Admitting: Radiology

## 2024-02-09 ENCOUNTER — Ambulatory Visit (INDEPENDENT_AMBULATORY_CARE_PROVIDER_SITE_OTHER): Admitting: Radiology

## 2024-02-09 ENCOUNTER — Other Ambulatory Visit (HOSPITAL_COMMUNITY)
Admission: RE | Admit: 2024-02-09 | Discharge: 2024-02-09 | Disposition: A | Discharge status: Home / Self Care | Source: Ambulatory Visit | Attending: Radiology | Admitting: Radiology

## 2024-02-09 VITALS — BP 136/86 | HR 61 | Wt 282.0 lb

## 2024-02-09 DIAGNOSIS — R9389 Abnormal findings on diagnostic imaging of other specified body structures: Secondary | ICD-10-CM | POA: Insufficient documentation

## 2024-02-09 DIAGNOSIS — Z01812 Encounter for preprocedural laboratory examination: Secondary | ICD-10-CM

## 2024-02-09 LAB — PREGNANCY, URINE: Preg Test, Ur: NEGATIVE

## 2024-02-09 NOTE — Progress Notes (Signed)
      ENDOMETRIAL BIOPSY       Sherri Lindsey 48 y.o. presents for endometrial biopsy. Reason for biopsy: thickened endometrium.  Narrative & Impression  Indication: Menorrhagia   Vaginal ultrasound   Anteverted  Enlarged size 13.05 x 9.54 x 7.75 cm Multiple fibroids the largest being 7.95 x 6.45cm   Endometrium distorted by fibroids 9.49mm No masses or thickening seen   Right ovary not seen Left ovary with simple follicle   Impression:Fibroid uterus    The indications for endometrial biopsy were reviewed.    Risks of the biopsy including cramping, bleeding, infection, uterine perforation, inadequate specimen and need for additional procedures  were discussed.  The patient states she understands and agrees to undergo procedure today. Consent obtained.  Time out was performed.   Procedure Speculum inserted into the vagina, cervix visualized and was prepped with Betadine. A single-toothed tenaculum was placed on the anterior lip of the cervix to stabilize it.  The 3 mm pipelle was introduced into the endometrial cavity without difficulty to a depth of 9cm, suction initiated and a moderate amount of tissue was obtained and sent to pathology.  The instruments were removed from the patient's vagina.  Minimal bleeding from the cervix was noted.  The patient tolerated the procedure well.   Darice Hoit, CMA present for exam  Assessment/Plan: 1. Thickened endometrium (Primary) - Surgical pathology( [facility]/ [facility])  2. Encounter for preprocedural laboratory examination - Pregnancy, urine   Routine post-procedure instructions were given to the patient.   Will contact with results of biopsy.    Alba Perillo, WHNP

## 2024-02-10 ENCOUNTER — Inpatient Hospital Stay

## 2024-02-10 ENCOUNTER — Ambulatory Visit

## 2024-02-10 ENCOUNTER — Inpatient Hospital Stay: Attending: Physician Assistant

## 2024-02-10 VITALS — BP 138/84 | HR 61 | Temp 98.9°F | Resp 18 | Ht 63.0 in | Wt 281.6 lb

## 2024-02-10 DIAGNOSIS — D500 Iron deficiency anemia secondary to blood loss (chronic): Secondary | ICD-10-CM | POA: Insufficient documentation

## 2024-02-10 DIAGNOSIS — Z79899 Other long term (current) drug therapy: Secondary | ICD-10-CM | POA: Diagnosis not present

## 2024-02-10 DIAGNOSIS — N920 Excessive and frequent menstruation with regular cycle: Secondary | ICD-10-CM | POA: Insufficient documentation

## 2024-02-10 LAB — IRON AND IRON BINDING CAPACITY (CC-WL,HP ONLY)
Iron: 25 ug/dL — ABNORMAL LOW (ref 28–170)
Saturation Ratios: 6 % — ABNORMAL LOW (ref 10.4–31.8)
TIBC: 414 ug/dL (ref 250–450)
UIBC: 389 ug/dL (ref 148–442)

## 2024-02-10 LAB — CBC WITH DIFFERENTIAL (CANCER CENTER ONLY)
Abs Immature Granulocytes: 0.01 K/uL (ref 0.00–0.07)
Basophils Absolute: 0.1 K/uL (ref 0.0–0.1)
Basophils Relative: 2 %
Eosinophils Absolute: 0 K/uL (ref 0.0–0.5)
Eosinophils Relative: 1 %
HCT: 35.5 % — ABNORMAL LOW (ref 36.0–46.0)
Hemoglobin: 10.6 g/dL — ABNORMAL LOW (ref 12.0–15.0)
Immature Granulocytes: 0 %
Lymphocytes Relative: 30 %
Lymphs Abs: 1.5 K/uL (ref 0.7–4.0)
MCH: 21.7 pg — ABNORMAL LOW (ref 26.0–34.0)
MCHC: 29.9 g/dL — ABNORMAL LOW (ref 30.0–36.0)
MCV: 72.7 fL — ABNORMAL LOW (ref 80.0–100.0)
Monocytes Absolute: 0.3 K/uL (ref 0.1–1.0)
Monocytes Relative: 5 %
Neutro Abs: 3 K/uL (ref 1.7–7.7)
Neutrophils Relative %: 62 %
Platelet Count: 291 K/uL (ref 150–400)
RBC: 4.88 MIL/uL (ref 3.87–5.11)
RDW: 30.5 % — ABNORMAL HIGH (ref 11.5–15.5)
WBC Count: 4.8 K/uL (ref 4.0–10.5)
nRBC: 0 % (ref 0.0–0.2)

## 2024-02-10 LAB — FERRITIN: Ferritin: 40 ng/mL (ref 11–307)

## 2024-02-10 MED ORDER — IRON SUCROSE 20 MG/ML IV SOLN
200.0000 mg | Freq: Once | INTRAVENOUS | Status: AC
Start: 2024-02-10 — End: 2024-02-10
  Administered 2024-02-10: 200 mg via INTRAVENOUS
  Filled 2024-02-10: qty 10

## 2024-02-10 NOTE — Progress Notes (Signed)
 Diagnosis: Acute Anemia  Provider:  Praveen Mannam MD  Procedure: IV Push  IV Type: Peripheral, IV Location: Left wrist  Venofer  (Iron  Sucrose), Dose: 200 mg  Post Infusion IV Care: Patient declined observation and Peripheral IV Discontinued  Discharge: Condition: Good, Destination: Home . AVS Declined  Performed by:  Donny Childes, RN

## 2024-02-13 ENCOUNTER — Ambulatory Visit: Payer: Self-pay | Admitting: Physician Assistant

## 2024-02-17 ENCOUNTER — Ambulatory Visit

## 2024-02-17 VITALS — BP 120/79 | HR 73 | Temp 98.2°F | Resp 18 | Ht 63.0 in | Wt 277.2 lb

## 2024-02-17 DIAGNOSIS — N920 Excessive and frequent menstruation with regular cycle: Secondary | ICD-10-CM

## 2024-02-17 DIAGNOSIS — D500 Iron deficiency anemia secondary to blood loss (chronic): Secondary | ICD-10-CM

## 2024-02-17 MED ORDER — IRON SUCROSE 20 MG/ML IV SOLN
200.0000 mg | Freq: Once | INTRAVENOUS | Status: AC
  Administered 2024-02-17: 200 mg via INTRAVENOUS
  Filled 2024-02-17: qty 10

## 2024-02-17 NOTE — Progress Notes (Signed)
 Diagnosis: Iron Deficiency Anemia  Provider:  Chilton Greathouse MD  Procedure: IV Push  IV Type: Peripheral, IV Location: L Forearm  Venofer (Iron Sucrose), Dose: 200 mg  Post Infusion IV Care: Patient declined observation and Peripheral IV Discontinued  Discharge: Condition: Good, Destination: Home . AVS Declined  Performed by:  Garnette Czech, RN

## 2024-02-20 LAB — SURGICAL PATHOLOGY

## 2024-02-21 ENCOUNTER — Other Ambulatory Visit: Payer: Self-pay | Admitting: Radiology

## 2024-02-21 ENCOUNTER — Ambulatory Visit: Payer: Self-pay | Admitting: Radiology

## 2024-02-21 DIAGNOSIS — N920 Excessive and frequent menstruation with regular cycle: Secondary | ICD-10-CM

## 2024-02-21 MED ORDER — MYFEMBREE 40-1-0.5 MG PO TABS: 1.0000 | ORAL_TABLET | Freq: Every day | ORAL | 3 refills | Status: DC

## 2024-02-21 NOTE — Telephone Encounter (Signed)
 RX sent. Please have patient follow up in 3 months.

## 2024-03-06 ENCOUNTER — Inpatient Hospital Stay

## 2024-03-26 NOTE — Telephone Encounter (Signed)
 Spoke with patient. Patient started Myfembree  August. Reports Menses 03/02/24 and again 03/19/24. Bleeding comes and goes, cramping comes with bleeding. Cramping is intense, 8 out of 10 on pain scale, not relieved by motrin, tylenol  or heating pad.   Currently changing saturated pad q2-3 hours. Denies Nausea/vomiting, fever/chills. Headaches were present prior to starting new medication, states she has had headaches with menses prior. No misses or late pills. Has not been sexually active since last UPT.   Does not want to try IUD.   Has 3 mo f/u scheduled 05/03/24.   Precautions provided for new/worsening symptoms and heavy bleeding.   Advised I will send to Jami to review and follow-up with any additional recommendations.

## 2024-03-26 NOTE — Telephone Encounter (Signed)
 Spoke with patient, advised per Jami.   Patient request to schedule on 10/23. Consult scheduled for 05/03/24 at 1500 with Dr. Dallie. Patient will continue with alternating PRN Tylenol  and Motrin, will call if symptoms worsen or new symptoms develop.   Routing to provider for final review. Patient is agreeable to disposition. Will close encounter.  Cc: Dr. Dallie

## 2024-03-26 NOTE — Telephone Encounter (Signed)
 I recommend she follow up with EB or GH to discuss surgical options.

## 2024-04-01 ENCOUNTER — Other Ambulatory Visit: Payer: Self-pay | Admitting: Gastroenterology

## 2024-04-02 ENCOUNTER — Other Ambulatory Visit: Payer: Self-pay | Admitting: Physician Assistant

## 2024-04-02 DIAGNOSIS — D500 Iron deficiency anemia secondary to blood loss (chronic): Secondary | ICD-10-CM

## 2024-04-02 NOTE — Progress Notes (Unsigned)
 [facility] [phone]   [phone]  PROGRESS NOTE  Patient Care Team: System, Provider Not In as PCP - General  CHIEF COMPLAINTS/PURPOSE OF CONSULTATION:  Iron  deficiency anemia 2/2 menorrhagia  HISTORY OF PRESENTING ILLNESS:  Sherri Lindsey 48 y.o. female returns for a follow up for iron  deficiency anemia. She was last seen on 01/10/2024. In the interim, she received IV venofer  200 mg x 5 doses.   On exam today, Ms. Willig reports she is continuing to have heavy menstrual cycles. Her last cycle started 03/02/2024 and has continued with daily bleeding. She reports having persistent lower abdominal cramping which impacts her quality of life. She is alternating tylenol  and ibuprofen with no improvement. She denies nausea, vomiting or bowel habit changes. She did notice one episode of hematochezia this week without straining. She denies fevers, chills, sweats, shortness of breath, chest pain or cough. She has no other complaints. Rest of the ROS is below.   MEDICAL HISTORY:  Past Medical History:  Diagnosis Date   Acid reflux    Anemia    Edema 07/12/2012   intermittent since 2014, diuretic use prior   Gestational diabetes 07/12/2000   Migraine    Strabismus    Ulcer    Wears glasses     SURGICAL HISTORY: Past Surgical History:  Procedure Laterality Date   CESAREAN SECTION     2002, 2005   CHOLECYSTECTOMY     KNEE SURGERY     arthroscopic   TONSILLECTOMY      SOCIAL HISTORY: Social History   Socioeconomic History   Marital status: Single    Spouse name: Not on file   Number of children: 2   Years of education: Not on file   Highest education level: Not on file  Occupational History   Occupation: customer service  Tobacco Use   Smoking status: Never    Passive exposure: Never   Smokeless tobacco: Never  Vaping Use   Vaping status: Never Used  Substance and Sexual Activity   Alcohol use: Yes    Comment: rarely   Drug use: No    Sexual activity: Not Currently    Partners: Male    Birth control/protection: Condom    Comment: menarche 48yo, sexual debut 48yo  Other Topics Concern   Not on file  Social History Narrative   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, mom is currently independent   Social Drivers of Health   Financial Resource Strain: High Risk (12/23/2023)   Received from [facility]   Financial Resource Strain    How hard is it for you to pay for the very basics like food, housing, medical care, and heating?: Hard  Food Insecurity: Food Insecurity Present (12/23/2023)   Received from [facility]   Hunger Vital Sign    Within the past 12 months, you worried that your food would run out before you got the money to buy more.: Sometimes true    Within the past 12 months, the food you bought just didn't last and you didn't have money to get more.: Sometimes true  Transportation Needs: No Transportation Needs (12/23/2023)   Received from [facility]   PRAPARE - Transportation    Lack of Transportation (Medical): No    Lack of Transportation (Non-Medical): No  Physical Activity: Insufficiently Active (12/23/2023)   Received from [facility]   Exercise Vital Sign  On average, how many days per week do you engage in moderate to strenuous exercise (like a brisk walk)?: 3 days    On average, how many minutes do you engage in exercise at this level?: 20 min  Stress: Stress Concern Present (12/23/2023)   Received from [facility]   [employer] of Occupational Health - Occupational Stress Questionnaire    Feeling of Stress : Very much  Social Connections: Moderately Integrated (12/23/2023)   Received from [facility]   Social Connections    In a typical week, how many times do you talk on the phone with family, friends, or neighbors?: More than three times a week    How often do you get  together with friends or relatives?: Once a week    How often do you attend church or religious services?: More than 4 times per year    Do you belong to any clubs or organizations such as church groups, unions, fraternal or athletic groups, or school groups?: Yes    How often do you attend meetings of the clubs or organizations you belong to?: More than 4 times per year    Are you married, widowed, divorced, separated, never married, or living with a partner?: Divorced  Intimate Partner Violence: Not At Risk (08/27/2022)   Humiliation, Afraid, Rape, and Kick questionnaire    Fear of Current or Ex-Partner: No    Emotionally Abused: No    Physically Abused: No    Sexually Abused: No    FAMILY HISTORY: Family History  Problem Relation Age of Onset   Hypertension Mother    Diabetes Mother    Hypertension Father    Kidney failure Father    Hyperlipidemia Father    Hypertension Sister    Kidney failure Sister    Hypertension Brother    Hypertension Maternal Grandmother    Heart disease Neg Hx    Stomach cancer Neg Hx    Rectal cancer Neg Hx    Esophageal cancer Neg Hx    Colon cancer Neg Hx     ALLERGIES:  has no known allergies.  MEDICATIONS:  Current Outpatient Medications  Medication Sig Dispense Refill   acetaminophen  (TYLENOL ) 500 MG tablet Take 500 mg by mouth every 6 (six) hours as needed for moderate pain (pain score 4-6).     ferrous sulfate  325 (65 FE) MG EC tablet Take 1 tablet (325 mg total) by mouth daily with breakfast. Take with a source of vitamin C. 30 tablet 3   pantoprazole  (PROTONIX ) 40 MG tablet Take 1 tablet (40 mg total) by mouth daily. Please call [phone] to schedule an office visit for more refills 90 tablet 0   Relugolix-Estradiol-Norethind (MYFEMBREE ) 40-1-0.5 MG TABS Take 1 tablet by mouth daily. 28 tablet 3   No current facility-administered medications for this visit.    REVIEW OF SYSTEMS:   Constitutional: ( - ) fevers, ( - )  chills , ( - )  night sweats Eyes: ( - ) blurriness of vision, ( - ) double vision, ( - ) watery eyes Ears, nose, mouth, throat, and face: ( - ) mucositis, ( - ) sore throat Respiratory: ( - ) cough, ( - ) dyspnea, ( - ) wheezes Cardiovascular: ( - ) palpitation, ( - ) chest discomfort, ( - ) lower extremity swelling Gastrointestinal:  ( - ) nausea, ( - ) heartburn, ( - ) change in bowel habits Skin: ( - ) abnormal skin rashes Lymphatics: ( - )  new lymphadenopathy, ( - ) easy bruising Neurological: ( - ) numbness, ( - ) tingling, ( - ) new weaknesses Behavioral/Psych: ( - ) mood change, ( - ) new changes  All other systems were reviewed with the patient and are negative.  PHYSICAL EXAMINATION: ECOG PERFORMANCE STATUS: 1 - Symptomatic but completely ambulatory  There were no vitals filed for this visit.  There were no vitals filed for this visit.   GENERAL: well appearing female in NAD  SKIN: skin color, texture, turgor are normal, no rashes or significant lesions EYES: conjunctiva are pink and non-injected, sclera clear LUNGS: clear to auscultation and percussion with normal breathing effort HEART: regular rate & rhythm and no murmurs and no lower extremity edema Musculoskeletal: no cyanosis of digits and no clubbing  PSYCH: alert & oriented x 3, fluent speech NEURO: no focal motor/sensory deficits  LABORATORY DATA:  I have reviewed the data as listed    Latest Ref Rng & Units 04/03/2024   10:00 AM 02/10/2024    2:48 PM 01/10/2024   10:58 AM  CBC  WBC 4.0 - 10.5 K/uL 4.0  4.8  6.9   Hemoglobin 12.0 - 15.0 g/dL 89.6  89.3  8.7   Hematocrit 36.0 - 46.0 % 33.9  35.5  30.2   Platelets 150 - 400 K/uL 278  291  203        Latest Ref Rng & Units 04/03/2024   10:00 AM 01/10/2024   10:58 AM 12/28/2023    7:28 PM  CMP  Glucose 70 - 99 mg/dL 894  864  97   BUN 6 - 20 mg/dL 10  9  12    Creatinine 0.44 - 1.00 mg/dL 9.08  8.98  9.12   Sodium 135 - 145 mmol/L 136  137  135   Potassium 3.5 - 5.1 mmol/L  4.1  3.9  3.7   Chloride 98 - 111 mmol/L 105  106  100   CO2 22 - 32 mmol/L 25  25  25    Calcium 8.9 - 10.3 mg/dL 8.9  8.8  9.1   Total Protein 6.5 - 8.1 g/dL 7.6  7.7    Total Bilirubin 0.0 - 1.2 mg/dL 0.5  0.8    Alkaline Phos 38 - 126 U/L 68  69    AST 15 - 41 U/L 10  13    ALT 0 - 44 U/L 7  10      RADIOGRAPHIC STUDIES: I have personally reviewed the radiological images as listed and agreed with the findings in the report. No results found.   ASSESSMENT & PLAN Chakita Mcgraw is a 48 y.o. female who presents to the clinic for a follow up for iron  deficiency anemia.   # Iron  Deficiency Anemia 2/2 to GYN Bleeding --Findings are consistent with iron  deficiency anemia secondary to patient's menorrhagia --Under the care of OB/GYN trying to better control her menstrual bleeding. Vaginal US  did revealed multiple fibroids. Currently on hormone therapy with Myfembree .  --Last received IV venofer  200 mg x 5 doses from 01/20/2024-02/17/2024. --Labs from today show persistent anemia with Hgb 10.3, MCV 74.7. Iron  panel shows deficiency with iron  22, saturation 5%, ferritin pending.  --Recommend ferrous sulfate  325 mg daily with a source of vitamin C --Encouraged to eat iron  rich foods.  --Due to persistent iron  deficiency anemia after completing IV venofer  x 5 doses, I will request IV monoferric.  --RTC for labs in 6 weeks and 12 weeks. RTC for labs/follow up in 18  weeks.   #Hematochezia: --Had one episode this week, no constipation or straining --Advised to monitor closely and follow up with GI if there are repeat episodes.  --Patient had EGD/colonoscopy in May 2024 with no source of GI bleeding.    Orders Placed This Encounter  Procedures   CBC with Differential (Cancer Center Only)    Standing Status:   Standing    Number of Occurrences:   3    Expiration Date:   04/03/2025   CMP (Cancer Center only)    Standing Status:   Standing    Number of Occurrences:   3    Expiration Date:    04/03/2025   Ferritin    Standing Status:   Standing    Number of Occurrences:   3    Expiration Date:   04/03/2025   Iron  and Iron  Binding Capacity ([facility] only)    Standing Status:   Standing    Number of Occurrences:   3    Expiration Date:   04/03/2025    All questions were answered. The patient knows to call the clinic with any problems, questions or concerns.  I have spent a total of 30 minutes minutes of face-to-face and non-face-to-face time, preparing to see the patient,  performing a medically appropriate examination, counseling and educating the patient, ordering medications/tests/procedures, referring and communicating with other health care professionals, documenting clinical information in the electronic health record, independently interpreting results and communicating results to the patient, and care coordination.   Johnston Police, PA-C Department of Hematology/Oncology [facility] at [facility] Phone: [phone]

## 2024-04-03 ENCOUNTER — Inpatient Hospital Stay: Attending: Physician Assistant

## 2024-04-03 ENCOUNTER — Telehealth: Payer: Self-pay

## 2024-04-03 ENCOUNTER — Other Ambulatory Visit: Payer: Self-pay

## 2024-04-03 ENCOUNTER — Inpatient Hospital Stay (HOSPITAL_BASED_OUTPATIENT_CLINIC_OR_DEPARTMENT_OTHER): Admitting: Physician Assistant

## 2024-04-03 DIAGNOSIS — N920 Excessive and frequent menstruation with regular cycle: Secondary | ICD-10-CM | POA: Insufficient documentation

## 2024-04-03 DIAGNOSIS — D500 Iron deficiency anemia secondary to blood loss (chronic): Secondary | ICD-10-CM | POA: Diagnosis not present

## 2024-04-03 LAB — IRON AND IRON BINDING CAPACITY (CC-WL,HP ONLY)
Iron: 22 ug/dL — ABNORMAL LOW (ref 28–170)
Saturation Ratios: 5 % — ABNORMAL LOW (ref 10.4–31.8)
TIBC: 441 ug/dL (ref 250–450)
UIBC: 419 ug/dL (ref 148–442)

## 2024-04-03 LAB — CBC WITH DIFFERENTIAL (CANCER CENTER ONLY)
Abs Immature Granulocytes: 0.01 K/uL (ref 0.00–0.07)
Basophils Absolute: 0.1 K/uL (ref 0.0–0.1)
Basophils Relative: 1 %
Eosinophils Absolute: 0.1 K/uL (ref 0.0–0.5)
Eosinophils Relative: 1 %
HCT: 33.9 % — ABNORMAL LOW (ref 36.0–46.0)
Hemoglobin: 10.3 g/dL — ABNORMAL LOW (ref 12.0–15.0)
Immature Granulocytes: 0 %
Lymphocytes Relative: 37 %
Lymphs Abs: 1.5 K/uL (ref 0.7–4.0)
MCH: 22.7 pg — ABNORMAL LOW (ref 26.0–34.0)
MCHC: 30.4 g/dL (ref 30.0–36.0)
MCV: 74.7 fL — ABNORMAL LOW (ref 80.0–100.0)
Monocytes Absolute: 0.3 K/uL (ref 0.1–1.0)
Monocytes Relative: 8 %
Neutro Abs: 2.1 K/uL (ref 1.7–7.7)
Neutrophils Relative %: 53 %
Platelet Count: 278 K/uL (ref 150–400)
RBC: 4.54 MIL/uL (ref 3.87–5.11)
RDW: 19.6 % — ABNORMAL HIGH (ref 11.5–15.5)
WBC Count: 4 K/uL (ref 4.0–10.5)
nRBC: 0 % (ref 0.0–0.2)

## 2024-04-03 LAB — CMP (CANCER CENTER ONLY)
ALT: 7 U/L (ref 0–44)
AST: 10 U/L — ABNORMAL LOW (ref 15–41)
Albumin: 4.1 g/dL (ref 3.5–5.0)
Alkaline Phosphatase: 68 U/L (ref 38–126)
Anion gap: 6 (ref 5–15)
BUN: 10 mg/dL (ref 6–20)
CO2: 25 mmol/L (ref 22–32)
Calcium: 8.9 mg/dL (ref 8.9–10.3)
Chloride: 105 mmol/L (ref 98–111)
Creatinine: 0.91 mg/dL (ref 0.44–1.00)
GFR, Estimated: >60 mL/min (ref >60)
Glucose, Bld: 105 mg/dL — ABNORMAL HIGH (ref 70–99)
Potassium: 4.1 mmol/L (ref 3.5–5.1)
Sodium: 136 mmol/L (ref 135–145)
Total Bilirubin: 0.5 mg/dL (ref 0.0–1.2)
Total Protein: 7.6 g/dL (ref 6.5–8.1)

## 2024-04-03 LAB — FERRITIN: Ferritin: 7 ng/mL — ABNORMAL LOW (ref 11–307)

## 2024-04-03 NOTE — Telephone Encounter (Signed)
 I called her to check on her symptoms. She stated that she knows it can take 3 mths for the medication but that she has severe cramping. Out of 10 she is at a strong 8. She says it is so severe that sometimes she can not move. Tylenol  and ibuprofen do not help. She says she has been having bleeding everyday this month. This morning bleeding was lighter. She has some fatigue but no nausea or vomiting. Patient is asking what else can be done to help. Please advise. Routing to [place], NP

## 2024-04-03 NOTE — Telephone Encounter (Signed)
 Please schedule with EB or GH to further discuss options.

## 2024-04-03 NOTE — Telephone Encounter (Signed)
-----   Message from CHRZANOWSKI, [place] B sent at 04/03/2024 12:12 PM EDT ----- Regarding: Patient follow up Please follow up with patient to assess her symptoms. Was just started on Myfembree  last month. Can take up to 3 months to improve symptoms. ----- Message ----- From: Neomi Johnston ONEIDA DEVONNA Sent: 04/03/2024  10:55 AM EDT To: Jami B Chrzanowski, NP  Erskin Cozier,  I saw Ms. Arnall today for a follow up for IDA. She is c/o progressive lower abdominal cramping with heavy bleeding that isn't improving with alternating tylenol  and ibuprofen. I wanted to make you aware and if you can follow up with her to discuss any alternatives for pain management.   Thanks, Johnston

## 2024-04-03 NOTE — Telephone Encounter (Signed)
 Left detailed message (DPR signed) for patient to call to schedule consult with Dr Glennon or Dr Dallie.

## 2024-04-05 ENCOUNTER — Ambulatory Visit: Payer: Self-pay | Admitting: Physician Assistant

## 2024-04-05 NOTE — Telephone Encounter (Signed)
 Per review of EPIC, patient is scheduled to see Dr. Dallie on 05/03/24.   Routing FYI.   Encounter closed.

## 2024-04-09 ENCOUNTER — Telehealth: Payer: Self-pay | Admitting: Pharmacy Technician

## 2024-04-09 NOTE — Telephone Encounter (Signed)
 Johnston, The monoferric has been denied.  I have re-submitted the auth for Feraheme.   Would you like to use Feraheme??   Feraheme: Pending  Auth Submission: PENDING Site of care: Site of care: [facility] Payer: [employer] Medication & CPT/J Code(s) submitted: Feraheme (ferumoxytol) [id number] Diagnosis Code: D50.9 Route of submission (phone, fax, portal):  Phone # Fax # Auth type: Buy/Bill PB Units/visits requested: X2 DOSES Reference number: Transaction ID: [id number] Customer ID: [id number] Transaction Date: 2024-04-09 Approval from:  to

## 2024-04-12 NOTE — Telephone Encounter (Signed)
 F/U: They have called me back and the Feraheme was denied as well.  Ref: [id number]  Rep: Jasmine.  Would you like to use Venofer ?  Auth Submission: NO AUTH NEEDED Site of care: Site of care: [facility] Payer: [employer] Medication & CPT/J Code(s) submitted: Venofer  (Iron  Sucrose) J1756 Diagnosis Code:  Route of submission (phone, fax, portal):  Phone # Fax # Auth type: Buy/Bill PB Units/visits requested:  Reference number:  Approval from: 04/12/24 to 07/11/24

## 2024-04-12 NOTE — Telephone Encounter (Signed)
 Hello, I have called [employer] and its still pending. Normally Feraheme is non preferred but due to her Ferritin being below 30, I have submitted the auth.(Hoping it will be approved).  If the Allegra is denied would you like to use Venofer  (preferred med)

## 2024-04-12 NOTE — Telephone Encounter (Signed)
 Certainly,  You can schedule a peer to peer or an appeal.  (The peer to peer may be a faster option) Phone: [phone] Ref: [id number].  The Monoferric denial letter has been indexed to the media tab for your review.

## 2024-04-19 ENCOUNTER — Telehealth: Payer: Self-pay

## 2024-04-19 ENCOUNTER — Other Ambulatory Visit (HOSPITAL_COMMUNITY): Payer: Self-pay | Admitting: Physician Assistant

## 2024-04-19 NOTE — Telephone Encounter (Signed)
 Johnston, patient will be scheduled as soon as possible.  Auth Submission: NO AUTH NEEDED Site of care: Site of care: [facility] Payer: [employer] commercial Medication & CPT/J Code(s) submitted: Venofer  (Iron  Sucrose) J1756 Diagnosis Code:  Route of submission (phone, fax, portal):  Phone # Fax # Auth type: Buy/Bill PB Units/visits requested: 200mg  x 5 doses Reference number:  Approval from: 04/19/24 to 07/11/24

## 2024-04-19 NOTE — Telephone Encounter (Signed)
 Johnston, We will get the patient scheduled. Thanks.

## 2024-04-23 ENCOUNTER — Other Ambulatory Visit: Payer: Self-pay | Admitting: Gastroenterology

## 2024-04-23 ENCOUNTER — Telehealth: Payer: Self-pay | Admitting: Gastroenterology

## 2024-04-23 MED ORDER — PANTOPRAZOLE SODIUM 40 MG PO TBEC: 40.0000 mg | DELAYED_RELEASE_TABLET | Freq: Every day | ORAL | 1 refills | Status: DC

## 2024-04-23 NOTE — Telephone Encounter (Signed)
 Rx for [facility]oprazole  [facility] [address] as requested.

## 2024-04-23 NOTE — Telephone Encounter (Signed)
 Requesting medication refill for [facility] [address].   Scheduled for next available ov with PA.   Please advise.

## 2024-05-03 ENCOUNTER — Ambulatory Visit: Admitting: Obstetrics and Gynecology

## 2024-05-03 ENCOUNTER — Ambulatory Visit: Admitting: Radiology

## 2024-05-03 ENCOUNTER — Ambulatory Visit

## 2024-05-03 ENCOUNTER — Encounter: Payer: Self-pay | Admitting: Obstetrics and Gynecology

## 2024-05-03 VITALS — BP 118/76 | HR 87 | Temp 97.6°F | Ht 64.5 in | Wt 283.0 lb

## 2024-05-03 VITALS — BP 121/81 | HR 67 | Temp 98.2°F | Resp 16 | Ht 63.0 in | Wt 285.2 lb

## 2024-05-03 DIAGNOSIS — R102 Pelvic and perineal pain unspecified side: Secondary | ICD-10-CM | POA: Diagnosis not present

## 2024-05-03 DIAGNOSIS — N920 Excessive and frequent menstruation with regular cycle: Secondary | ICD-10-CM

## 2024-05-03 DIAGNOSIS — D251 Intramural leiomyoma of uterus: Secondary | ICD-10-CM

## 2024-05-03 DIAGNOSIS — D509 Iron deficiency anemia, unspecified: Secondary | ICD-10-CM | POA: Diagnosis not present

## 2024-05-03 DIAGNOSIS — N921 Excessive and frequent menstruation with irregular cycle: Secondary | ICD-10-CM | POA: Diagnosis not present

## 2024-05-03 DIAGNOSIS — N938 Other specified abnormal uterine and vaginal bleeding: Secondary | ICD-10-CM

## 2024-05-03 DIAGNOSIS — D250 Submucous leiomyoma of uterus: Secondary | ICD-10-CM

## 2024-05-03 DIAGNOSIS — D252 Subserosal leiomyoma of uterus: Secondary | ICD-10-CM

## 2024-05-03 MED ORDER — IBUPROFEN 800 MG PO TABS: 800.0000 mg | ORAL_TABLET | Freq: Three times a day (TID) | ORAL | 2 refills | Status: AC | PRN

## 2024-05-03 MED ORDER — IRON SUCROSE 20 MG/ML IV SOLN
200.0000 mg | Freq: Once | INTRAVENOUS | Status: AC
  Administered 2024-05-03: 200 mg via INTRAVENOUS

## 2024-05-03 MED ORDER — MYFEMBREE 40-1-0.5 MG PO TABS: 1.0000 | ORAL_TABLET | Freq: Every day | ORAL | 11 refills | Status: AC

## 2024-05-03 NOTE — Progress Notes (Signed)
 Diagnosis: Iron Deficiency Anemia  Provider:  Chilton Greathouse MD  Procedure: IV Push  IV Type: Peripheral, IV Location: L Forearm  Venofer (Iron Sucrose), Dose: 200 mg  Post Infusion IV Care: Observation period completed and Peripheral IV Discontinued  Discharge: Condition: Good, Destination: Home . AVS Declined  Performed by:  Forrest Moron, RN

## 2024-05-03 NOTE — Progress Notes (Signed)
 48 y.o. [id number] female with longstanding AUB, severe anemia (following with hematology for IV iron ), fibroids, severe dysmenorrhea, prior cesarean delivery x2 here for surgical consultation. Single.  Referred by DOROTHA Bland, NP, who started patient on Myfembree  02/21/24.  Menstrual cycle has been continuous for the past 3 months despite myfembree  use. No longer saturating pads, however she is still having light periods with quarter sized clots. A lot of cramping and bleeding with activity. Has severe cramping 4/7d. Concerns with taking strength ibuprofen, takes between 400-800mg  ibuprofen with minimal pain improvement. Has tried COC for 2 month last year without improvement  No CP or SOB with stairs.  No LMP recorded (lmp unknown). (Menstrual status: Irregular Periods). Period Pattern: (!) Irregular Menstrual Flow: Moderate, Light Menstrual Control: Maxi pad Dysmenorrhea: (!) Severe Dysmenorrhea Symptoms: Headache  Last PAP:    Component Value Date/Time   DIAGPAP  01/21/2023 1416    - Negative for intraepithelial lesion or malignancy (NILM)   HPVHIGH Negative 01/21/2023 1416   ADEQPAP Satisfactory for evaluation. 01/21/2023 1416   02/07/24 TVUS: Enlarged uterine size 13.05 x 9.54 x 7.75 cm 9 fibroids the largest being 7.95 x 6.45cm   Endometrium distorted by fibroids 9.37mm No masses or thickening seen   Right ovary not seen Left ovary with simple follicle   Impression:Fibroid uterus  02/09/2024 pathology: A. ENDOMETRIUM, BIOPSY:  - Benign secretory endometrium, see comment  - Negative for hyperplasia or malignancy   12/23/2023 TSH within normal limits 04/03/24 Hgb 10.3  Birth control: None Sexually active: Not currently   GYN HISTORY: No sig hx  OB History  Gravida Para Term Preterm AB Living  3 2 2  1 2   SAB IAB Ectopic Multiple Live Births      2    # Outcome Date GA Lbr Len/2nd Weight Sex Type Anes PTL Lv  3 AB           2 Term      CS-Unspec   LIV   1 Term      CS-Unspec   LIV    Past Medical History:  Diagnosis Date   Acid reflux    Anemia    Edema 07/12/2012   intermittent since 2014, diuretic use prior   Gestational diabetes 07/12/2000   Migraine    Strabismus    Ulcer    Wears glasses     Past Surgical History:  Procedure Laterality Date   CESAREAN SECTION     2002, 2005   CHOLECYSTECTOMY     KNEE SURGERY     arthroscopic   TONSILLECTOMY      Current Outpatient Medications on File Prior to Visit  Medication Sig Dispense Refill   acetaminophen  (TYLENOL ) 500 MG tablet Take 500 mg by mouth every 6 (six) hours as needed for moderate pain (pain score 4-6).     ferrous sulfate  325 (65 FE) MG EC tablet Take 1 tablet (325 mg total) by mouth daily with breakfast. Take with a source of vitamin C. 30 tablet 3   pantoprazole  (PROTONIX ) 40 MG tablet TAKE 1 TABLET BY MOUTH DAILY. PLEASE CALL [phone] TO SCHEDULE AN OFFICE VISIT FOR REFILLS 30 tablet 0   No current facility-administered medications on file prior to visit.    No Known Allergies    PE Today's Vitals   05/03/24 1442  BP: 118/76  Pulse: 87  Temp: 97.6 F (36.4 C)  TempSrc: Oral  SpO2: 98%  Weight: 283 lb (128.4 kg)  Height: 5' 4.5 (1.638  m)   Body mass index is 47.83 kg/m.  Physical Exam Vitals reviewed.  Constitutional:      General: She is not in acute distress.    Appearance: Normal appearance.  HENT:     Head: Normocephalic and atraumatic.     Nose: Nose normal.  Eyes:     Extraocular Movements: Extraocular movements intact.     Conjunctiva/sclera: Conjunctivae normal.  Pulmonary:     Effort: Pulmonary effort is normal.  Musculoskeletal:        General: Normal range of motion.     Cervical back: Normal range of motion.  Neurological:     General: No focal deficit present.     Mental Status: She is alert.  Psychiatric:        Mood and Affect: Mood normal.        Behavior: Behavior normal.      Assessment and Plan:         Dysfunctional uterine bleeding  Iron  deficiency anemia, unspecified iron  deficiency anemia type  Intramural, submucous, and subserous leiomyoma of uterus  Menorrhagia with regular cycle -     Myfembree ; Take 1 tablet by mouth daily.  Dispense: 28 tablet; Refill: 11  Pelvic pain -     Ibuprofen; Take 1 tablet (800 mg total) by mouth every 8 (eight) hours as needed.  Dispense: 60 tablet; Refill: 2  Continue myfembree  and IV fe infusions, rx for ibuprofen sent for dysmenorrhea We also discussed hysteroscopic submucosal myomectomy + Mirena , UAE, and RLH+ BS Patient undecided at this time and would like call once she has decided She can continue myfembree  for 65yr given initial improvement  Vera LULLA Pa, MD

## 2024-05-04 ENCOUNTER — Ambulatory Visit

## 2024-05-11 ENCOUNTER — Ambulatory Visit (INDEPENDENT_AMBULATORY_CARE_PROVIDER_SITE_OTHER)

## 2024-05-11 VITALS — BP 144/98 | HR 62 | Temp 98.4°F | Resp 20 | Ht 64.0 in | Wt 288.4 lb

## 2024-05-11 DIAGNOSIS — D509 Iron deficiency anemia, unspecified: Secondary | ICD-10-CM | POA: Diagnosis not present

## 2024-05-11 MED ORDER — IRON SUCROSE 20 MG/ML IV SOLN
200.0000 mg | Freq: Once | INTRAVENOUS | Status: AC
  Administered 2024-05-11: 200 mg via INTRAVENOUS
  Filled 2024-05-11: qty 10

## 2024-05-11 MED ORDER — SODIUM CHLORIDE 0.9 % IV BOLUS (SEPSIS)
250.0000 mL | Freq: Once | INTRAVENOUS | Status: DC
  Filled 2024-05-11: qty 250

## 2024-05-11 NOTE — Progress Notes (Signed)
 Diagnosis: Iron  Deficiency Anemia  Provider:  Praveen Mannam MD  Procedure: IV Push  IV Type: Peripheral, IV Location: R Forearm  Venofer  (Iron  Sucrose), Dose: 200 mg  Post Infusion IV Care: Patient declined observation and Peripheral IV Discontinued  Discharge: Condition: Good, Destination: Home . AVS Declined  Performed by:  Leita FORBES Miles, LPN

## 2024-05-18 ENCOUNTER — Inpatient Hospital Stay: Attending: Physician Assistant

## 2024-05-18 ENCOUNTER — Ambulatory Visit

## 2024-05-18 MED ORDER — IRON SUCROSE 20 MG/ML IV SOLN: 200.0000 mg | Freq: Once | INTRAVENOUS | Status: DC

## 2024-05-25 ENCOUNTER — Ambulatory Visit (INDEPENDENT_AMBULATORY_CARE_PROVIDER_SITE_OTHER)

## 2024-05-25 VITALS — BP 153/93 | HR 67 | Temp 97.5°F | Resp 20 | Ht 64.0 in | Wt 288.6 lb

## 2024-05-25 DIAGNOSIS — D509 Iron deficiency anemia, unspecified: Secondary | ICD-10-CM | POA: Diagnosis not present

## 2024-05-25 MED ORDER — SODIUM CHLORIDE 0.9 % IV BOLUS (SEPSIS)
250.0000 mL | Freq: Once | INTRAVENOUS | Status: DC
  Filled 2024-05-25: qty 250

## 2024-05-25 MED ORDER — IRON SUCROSE 20 MG/ML IV SOLN
200.0000 mg | Freq: Once | INTRAVENOUS | Status: AC
  Administered 2024-05-25: 200 mg via INTRAVENOUS
  Filled 2024-05-25: qty 10

## 2024-05-25 NOTE — Progress Notes (Signed)
 Diagnosis: Iron Deficiency Anemia  Provider:  Chilton Greathouse MD  Procedure: IV Push  IV Type: Peripheral, IV Location: L Forearm  Venofer (Iron Sucrose), Dose: 200 mg  Post Infusion IV Care: Patient declined observation and Peripheral IV Discontinued  Discharge: Condition: Good, Destination: Home . AVS Declined  Performed by:  Adriana Mccallum, RN

## 2024-06-01 ENCOUNTER — Ambulatory Visit

## 2024-06-01 MED ORDER — IRON SUCROSE 20 MG/ML IV SOLN: 200.0000 mg | Freq: Once | INTRAVENOUS | Status: DC

## 2024-06-11 ENCOUNTER — Ambulatory Visit: Admitting: Gastroenterology

## 2024-06-11 ENCOUNTER — Encounter: Payer: Self-pay | Admitting: Gastroenterology

## 2024-06-11 VITALS — BP 144/92 | HR 70 | Ht 63.75 in | Wt 290.2 lb

## 2024-06-11 DIAGNOSIS — D509 Iron deficiency anemia, unspecified: Secondary | ICD-10-CM

## 2024-06-11 DIAGNOSIS — K2100 Gastro-esophageal reflux disease with esophagitis, without bleeding: Secondary | ICD-10-CM | POA: Diagnosis not present

## 2024-06-11 DIAGNOSIS — R110 Nausea: Secondary | ICD-10-CM | POA: Diagnosis not present

## 2024-06-11 MED ORDER — PANTOPRAZOLE SODIUM 40 MG PO TBEC: 40.0000 mg | DELAYED_RELEASE_TABLET | Freq: Two times a day (BID) | ORAL | 3 refills | Status: AC

## 2024-06-11 NOTE — Patient Instructions (Addendum)
 GERD -increase pantoprazole  40mg  po twice daily four weeks, then daily  -Pepcid at bedtime -message me via Mychart in 4 weeks on updates with nausea  Nausea  Small meals spread throughout the day No large meals No eating right before before bed Otc Ginger chews prn nausea   _______________________________________________________  If your blood pressure at your visit was 140/90 or greater, please contact your primary care physician to follow up on this.  _______________________________________________________  If you are age 46 or older, your body mass index should be between 23-30. Your Body mass index is 50.21 kg/m. If this is out of the aforementioned range listed, please consider follow up with your Primary Care Provider.  If you are age 61 or younger, your body mass index should be between 19-25. Your Body mass index is 50.21 kg/m. If this is out of the aformentioned range listed, please consider follow up with your Primary Care Provider.   ________________________________________________________  The [facility] providers would like to encourage you to use MYCHART to communicate with providers for non-urgent requests or questions.  Due to long hold times on the telephone, sending your provider a message by [facility] may be a faster and more efficient way to get a response.  Please allow 48 business hours for a response.  Please remember that this is for non-urgent requests.  _______________________________________________________  [facility] is using a team-based approach to care.  Your team is made up of your doctor and two to three APPS. Our APPS (Nurse Practitioners and Physician Assistants) work with your physician to ensure care continuity for you. They are fully qualified to address your health concerns and develop a treatment plan. They communicate directly with your gastroenterologist to care for you. Seeing the Advanced Practice Practitioners on your physician's  team can help you by facilitating care more promptly, often allowing for earlier appointments, access to diagnostic testing, procedures, and other specialty referrals.   Thank you for trusting me with your gastrointestinal care. Deanna May, FNP-C

## 2024-06-11 NOTE — Progress Notes (Signed)
 Chief Complaint:follow-up medication refills, GERD Primary GI Doctor:Dr. Charlanne  HPI:  Patient is a  48  year old female patient with past medical history of GERD with esophagitis and iron  deficiency anemia who presents for a follow-up on  .    Interval History Last seen in GI office on 11/15/22 by Harlene, PA for evaluation of iron  deficiency anemia.  Patient has history of GERD and currently taking Pantoprazole  40mg  po daily and pepcid at bedtime. Patient admits she has to takes it twice daily about 4 times a week due to increased symptoms in evening. She admits it Keeanna Villafranca be related to certain foods or eating her meal quickly. She will have reflux some days and other days nausea. No vomiting. The PPI helps with the reflux but not sure it helps with nausea. She will also have some epigastric discomfort. She is taking ibuprofen  only 1-2 days of menstrual cycle prn. No new medications. She does note stress can make it worse.  History of iron  deficiency anemia, she takes otc iron  1 tablet po daily. She is being managed by OB/GYN and hematology.  Wt Readings from Last 3 Encounters:  06/11/24 290 lb 4 oz (131.7 kg)  05/25/24 288 lb 9.6 oz (130.9 kg)  05/11/24 288 lb 6.4 oz (130.8 kg)    Past Medical History:  Diagnosis Date   Acid reflux    Anemia    Edema 07/12/2012   intermittent since 2014, diuretic use prior   Gestational diabetes 07/12/2000   Migraine    Strabismus    Ulcer    Wears glasses     Past Surgical History:  Procedure Laterality Date   CESAREAN SECTION     2002, 2005   CHOLECYSTECTOMY     KNEE SURGERY     arthroscopic   TONSILLECTOMY      Current Outpatient Medications  Medication Sig Dispense Refill   acetaminophen  (TYLENOL ) 500 MG tablet Take 500 mg by mouth every 6 (six) hours as needed for moderate pain (pain score 4-6).     ferrous sulfate  325 (65 FE) MG EC tablet Take 1 tablet (325 mg total) by mouth daily with breakfast. Take with a source of vitamin C. 30  tablet 3   ibuprofen  (ADVIL ) 800 MG tablet Take 1 tablet (800 mg total) by mouth every 8 (eight) hours as needed. 60 tablet 2   pantoprazole  (PROTONIX ) 40 MG tablet TAKE 1 TABLET BY MOUTH DAILY. PLEASE CALL [phone] TO SCHEDULE AN OFFICE VISIT FOR REFILLS 30 tablet 0   Relugolix-Estradiol-Norethind (MYFEMBREE ) 40-1-0.5 MG TABS Take 1 tablet by mouth daily. 28 tablet 11   No current facility-administered medications for this visit.    Allergies as of 06/11/2024   (No Known Allergies)    Family History  Problem Relation Age of Onset   Hypertension Mother    Diabetes Mother    Hypertension Father    Kidney failure Father    Hyperlipidemia Father    Hypertension Sister    Kidney failure Sister    Hypertension Brother    Hypertension Maternal Grandmother    Heart disease Neg Hx    Stomach cancer Neg Hx    Rectal cancer Neg Hx    Esophageal cancer Neg Hx    Colon cancer Neg Hx     Review of Systems:    Constitutional: No weight loss, fever, chills, weakness or fatigue HEENT: Eyes: No change in vision  Ears, Nose, Throat:  No change in hearing or congestion Skin: No rash or itching Cardiovascular: No chest pain, chest pressure or palpitations   Respiratory: No SOB or cough Gastrointestinal: See HPI and otherwise negative Genitourinary: No dysuria or change in urinary frequency Neurological: No headache, dizziness or syncope Musculoskeletal: No new muscle or joint pain Hematologic: No bleeding or bruising Psychiatric: No history of depression or anxiety    Physical Exam:  Vital signs: BP (!) 144/92 (BP Location: Left Arm, Patient Position: Sitting, Cuff Size: Large)   Pulse 70   Ht 5' 3.75 (1.619 m) Comment: height measured without shoes  Wt 290 lb 4 oz (131.7 kg)   BMI 50.21 kg/m   Constitutional:   Pleasant female appears to be in NAD, Well developed, Well nourished, alert and cooperative Throat: Oral cavity and pharynx without inflammation, swelling  or lesion.  Respiratory: Respirations even and unlabored. Lungs clear to auscultation bilaterally.   No wheezes, crackles, or rhonchi.  Cardiovascular: Normal S1, S2. Regular rate and rhythm. No peripheral edema, cyanosis or pallor.  Gastrointestinal:  Soft, nondistended, nontender. No rebound or guarding. Normal bowel sounds. No appreciable masses or hepatomegaly. Rectal:  Not performed.  Msk:  Symmetrical without gross deformities. Without edema, no deformity or joint abnormality.  Neurologic:  Alert and  oriented x4;  grossly normal neurologically.  Skin:   Dry and intact without significant lesions or rashes.  RELEVANT LABS AND IMAGING: CBC    Latest Ref Rng & Units 04/03/2024   10:00 AM 02/10/2024    2:48 PM 01/10/2024   10:58 AM  CBC  WBC 4.0 - 10.5 K/uL 4.0  4.8  6.9   Hemoglobin 12.0 - 15.0 g/dL 89.6  89.3  8.7   Hematocrit 36.0 - 46.0 % 33.9  35.5  30.2   Platelets 150 - 400 K/uL 278  291  203      CMP     Latest Ref Rng & Units 04/03/2024   10:00 AM 01/10/2024   10:58 AM 12/28/2023    7:28 PM  CMP  Glucose 70 - 99 mg/dL 894  864  97   BUN 6 - 20 mg/dL 10  9  12    Creatinine 0.44 - 1.00 mg/dL 9.08  8.98  9.12   Sodium 135 - 145 mmol/L 136  137  135   Potassium 3.5 - 5.1 mmol/L 4.1  3.9  3.7   Chloride 98 - 111 mmol/L 105  106  100   CO2 22 - 32 mmol/L 25  25  25    Calcium 8.9 - 10.3 mg/dL 8.9  8.8  9.1   Total Protein 6.5 - 8.1 g/dL 7.6  7.7    Total Bilirubin 0.0 - 1.2 mg/dL 0.5  0.8    Alkaline Phos 38 - 126 U/L 68  69    AST 15 - 41 U/L 10  13    ALT 0 - 44 U/L 7  10       Lab Results  Component Value Date   TSH 1.57 09/17/2016  11/17/22 colonoscopy - Mild sigmoid diverticulosis. - Non- bleeding internal hemorrhoids. - The examination was otherwise normal on direct and retroflexion views. Normal terminal ileum. - No specimens collected.  11/18/22 EGD - LA Grade A reflux esophagitis with no bleeding. Biopsied. - 5 cm hiatal hernia. - Gastritis. Biopsied. - Nodular  mucosa in the duodenal bulb- likely hypertrophied Jolanda' s glands. Biopsied. Path: Diagnosis 1. Surgical [P], small bowel bxs SMALL INTESTINAL MUCOSA WITH  PRESERVED VILLOGLANDULAR ARCHITECTURE WITHOUT INCREASED INTRAEPITHELIAL LYMPHOCYTES OR EVIDENCE OF ACTIVE INFLAMMATION. NO EVIDENCE OF GLUTEN SENSITIVE ENTEROPATHY.  2. Surgical [P], 1st portion duodenum DUODENAL MUCOSA WITH FOVEOLAR METAPLASIA AND BRUNNER GLAND HYPERPLASIA CONSISTENT WITH CHRONIC PEPTIC DUODENITIS. GASTRIC ANTRAL / OXYNTIC MUCOSA WITH CHRONIC INACTIVE GASTRITIS. NO H.PYLORI IDENTIFIED ON H&E. NEGATIVE FOR DYSPLASIA OR MALIGNANCY.  3. Surgical [P], gastric GASTRIC OXYNTIC MUCOSA WITH CHRONIC INACTIVE GASTRITIS. NO H.PYLORI IDENTIFIED ON H&E. NEGATIVE FOR INTESTINAL METAPLASIA, DYSPLASIA OR MALIGNANCY.  4. Surgical [P], distal esophagus ESOPHAGEAL SQUAMOUS MUCOSA WITH MARKED REACTIVE CHANGES SUGGESTIVE OF REFLUX DISEASE. GASTRIC CARDIA TYPE MUCOSA WITHOUT SIGNIFICANT DIAGNOSTIC ALTERATION. NEGATIVE FOR INTESTINAL METAPLASIA OR DYSPLASIA.  Assessment/Plan: Encounter Diagnoses  Name Primary?   Gastroesophageal reflux disease with esophagitis without hemorrhage Yes   Nausea without vomiting    Iron  deficiency anemia, unspecified iron  deficiency anemia type    #1 GERD with esophagitis, on EGD 11/2022  #2 Nausea, intermittent, worse with eating certain foods or stress -increase pantoprazole  40mg  po twice daily four weeks trial -consider gastric emptying study if no improvement #3 History of IDA 2/2 menorrhagia, vaginal u/s showed multiple fibroids 5/24 EGD/colon unremarkable with negative biopsy -continue follow-up with hematology for IV iron  #4 Colon screening surveillance, normal colonoscopy 5/24 -recall colonoscopy 11/2032  Thank you for the courtesy of this consult. Please call me with any questions or concerns.   Thaila Bottoms, FNP-C [facility] 06/11/2024, 9:16 AM  Cc: No ref. provider found

## 2024-06-15 ENCOUNTER — Ambulatory Visit

## 2024-06-22 ENCOUNTER — Ambulatory Visit

## 2024-06-22 MED ORDER — IRON SUCROSE 200 MG IVPB - SIMPLE MED: 200.0000 mg | Freq: Once | Status: DC

## 2024-06-29 ENCOUNTER — Inpatient Hospital Stay: Attending: Physician Assistant

## 2024-06-29 ENCOUNTER — Ambulatory Visit

## 2024-06-29 MED ORDER — IRON SUCROSE 200 MG IVPB - SIMPLE MED: 200.0000 mg | Freq: Once | Status: AC

## 2024-07-02 ENCOUNTER — Encounter: Payer: Self-pay | Admitting: Physician Assistant

## 2024-07-14 ENCOUNTER — Other Ambulatory Visit: Payer: Self-pay | Admitting: Obstetrics and Gynecology

## 2024-07-14 DIAGNOSIS — R102 Pelvic and perineal pain unspecified side: Secondary | ICD-10-CM

## 2024-07-16 NOTE — Telephone Encounter (Signed)
 Med refill request: IBUPROFEN  (ADVIL ) 800 MG TAB Last AEX: 01/21/23 JC Next AEX: not scheduled, sent message to front desk to schedule pt for annual visit Last MMG (if hormonal med) unknown Refill authorized: Please Advise? Last Rx sent #[id number] with 2 refills on 05/03/24 [facility]

## 2024-07-19 ENCOUNTER — Telehealth: Payer: Self-pay | Admitting: Physician Assistant

## 2024-07-19 NOTE — Telephone Encounter (Signed)
 I SPOKE WITH PATIENT AND SHE IS AWARE OF APPOINTMENT ON 08/15/2024.

## 2024-08-10 ENCOUNTER — Inpatient Hospital Stay

## 2024-08-10 ENCOUNTER — Inpatient Hospital Stay: Admitting: Physician Assistant

## 2024-08-13 ENCOUNTER — Encounter: Payer: Self-pay | Admitting: Physician Assistant

## 2024-08-14 ENCOUNTER — Telehealth: Payer: Self-pay | Admitting: Physician Assistant

## 2024-08-14 NOTE — Telephone Encounter (Signed)
 i called pt back regarding voicemail she left about rescheduling upcoming appt

## 2024-08-15 ENCOUNTER — Inpatient Hospital Stay

## 2024-08-15 ENCOUNTER — Inpatient Hospital Stay: Admitting: Physician Assistant

## 2024-08-24 ENCOUNTER — Ambulatory Visit: Admitting: Radiology

## 2024-09-07 ENCOUNTER — Inpatient Hospital Stay: Admitting: Physician Assistant

## 2024-09-07 ENCOUNTER — Inpatient Hospital Stay: Attending: Physician Assistant
